# Patient Record
Sex: Female | Born: 1995 | Race: White | Hispanic: No | Marital: Single | Smoking: Never smoker
Health system: Southern US, Community
[De-identification: ages and names within clinical notes are randomized; demographics above are authoritative.]

---

## 2019-02-15 DIAGNOSIS — J3501 Chronic tonsillitis: Secondary | ICD-10-CM | POA: Insufficient documentation

## 2019-12-30 DIAGNOSIS — Z23 Encounter for immunization: Secondary | ICD-10-CM | POA: Diagnosis not present

## 2020-01-27 ENCOUNTER — Ambulatory Visit (INDEPENDENT_AMBULATORY_CARE_PROVIDER_SITE_OTHER): Payer: BC Managed Care – PPO | Admitting: Family Medicine

## 2020-01-27 ENCOUNTER — Other Ambulatory Visit: Payer: Self-pay

## 2020-01-27 ENCOUNTER — Encounter: Payer: Self-pay | Admitting: Family Medicine

## 2020-01-27 DIAGNOSIS — Z0000 Encounter for general adult medical examination without abnormal findings: Secondary | ICD-10-CM | POA: Diagnosis not present

## 2020-01-27 DIAGNOSIS — Z7689 Persons encountering health services in other specified circumstances: Secondary | ICD-10-CM

## 2020-01-27 NOTE — Assessment & Plan Note (Signed)
Patient is here to establish care with a new provider as well as address routine health maintenance concerns. -Covid vaccination status confirmed -Pap is up-to-date at this time with most recent Pap smear being in December 2020, no record of results at this time.  Patient reports that she goes to [facility], will request records at next visit -Most recent STD screening was in December 2020 which she got when she had Pap smear -Discussed Tdap with patient he does not wish to have it at this time but knows that she needs it

## 2020-01-27 NOTE — Patient Instructions (Signed)
It was a pleasure to meet you today!  I have no concerns regarding your current health.  There is no need for any lab work or other tests at this time.  If you have any questions, concerns or healthcare needs please reach out to our clinic and we can schedule an appointment.  I hope you have a wonderful afternoon!

## 2020-01-27 NOTE — Progress Notes (Signed)
    SUBJECTIVE:   CHIEF COMPLAINT / HPI:  Patient presents for new patient  Current concerns Rash wich has been present for one week but is resolving.  Past medical history None   Past surgical history Wisdom teeth- bottom out in 2016   Allergies Season allergies   Family history Maternal IgA deficiency, Celiac? Paternal hyperlipidemia Brother with Crohns  Grandparents (Deseased) with Alzheimer's, type I and type 2 diabetes, and TIA  Aunts/uncles with skin cancer and depression  Social history Lives in [place], getting masters in nutritian. No pets. Social EtOH once a week (3 drinks or less), denies illicit drugs, tobacco, vaping. Sexually active, one partner, STD tested in December with pap.  Patient reports that she got an IUD when she had her Pap smear and STD testing in December.  She is going to follow-up with her obstetrician gynecologist regarding her increase in bleeding after the IUD.   Health maintenance  Most recent Pap smear was 2020 Tetanus shot in 2011, will repeat today  OBJECTIVE:   BP 102/62   Pulse 81   Ht 5\' 7"  (1.702 m)   Wt 144 lb 12.8 oz (65.7 kg)   LMP 01/20/2020 (Approximate)   SpO2 97%   BMI 22.68 kg/m   General: NAD, sitting comfortably, no acute distress HEENT: Atraumatic. Normocephalic. Normal oropharynx without erythema, lesions, exudate.  Neck: No cervical lymphadenopathy.  Cardiac: RRR, no m/r/g Respiratory: CTAB, normal work of breathing Abdomen: soft, nontender, nondistended, bowel sounds normal Skin: warm and dry, patient has healing rash on her elbows bilaterally.  She reports they have progressively gotten better over the last week and that they do not itch Neuro: alert and oriented    ASSESSMENT/PLAN:   Encounter to establish care with new doctor Patient is here to establish care with a new provider as well as address routine health maintenance concerns. -Covid vaccination status confirmed -Pap is up-to-date at this  time with most recent Pap smear being in December 2020, no record of results at this time.  Patient reports that she goes to [facility], will request records at next visit -Most recent STD screening was in December 2020 which she got when she had Pap smear -Discussed Tdap with patient he does not wish to have it at this time but knows that she needs it   January 2021, MD [facility]

## 2020-04-07 ENCOUNTER — Encounter: Payer: Self-pay | Admitting: Family Medicine

## 2020-04-08 DIAGNOSIS — J988 Other specified respiratory disorders: Secondary | ICD-10-CM | POA: Diagnosis not present

## 2020-05-17 ENCOUNTER — Encounter: Payer: Self-pay | Admitting: Family Medicine

## 2020-05-18 NOTE — Telephone Encounter (Signed)
I called patient to discuss this insect bites.  She reports that the symptoms are mildly improving and the swelling is gone down but that it kind of looks like a bruise around where the bug stung her.  Discussed options of being evaluated for possible infection versus keeping an eye on it and calling if symptoms get worse.  She reports that she will continue to watch it but if her symptoms progress she will call to schedule an appointment.  Strict ED precautions given.  No further questions or concerns.

## 2020-05-20 ENCOUNTER — Encounter: Payer: Self-pay | Admitting: Family Medicine

## 2020-05-28 ENCOUNTER — Encounter: Payer: Self-pay | Admitting: Family Medicine

## 2020-05-28 NOTE — Telephone Encounter (Signed)
Called patient regarding OB/GYN questions.  Informed the patient that we provide full-spectrum care and that we can do her Pap smear and that she will need 1 in 3 years assuming her previous Pap was normal.  She is going to get the results from her previous Pap and bring them to the clinic so that we have those results.  I also discussed her heart palpitations.  She reports that she has been having heart palpitations for years but has noticed them more recently.  They come on when she is lying around and she cannot identify any cause of the heart palpitations.  She does report that anxiety may have something to do with it but is unsure because she has never been diagnosed with any anxiety issues.  I discussed keeping track of when she has heart palpitations to search for a possible cause.  Patient is agreeable to this.  Denies any chest pain, shortness of breath associated with the heart palpitations.   Patient also has questions about if she can receive her Tdap.  I instructed her to call the clinic and schedule a nurse visit for Tdap.  Patient is agreeable to this, has no questions or concerns.

## 2020-08-03 ENCOUNTER — Encounter: Payer: Self-pay | Admitting: Family Medicine

## 2020-08-05 ENCOUNTER — Telehealth: Payer: Self-pay

## 2020-08-05 ENCOUNTER — Ambulatory Visit (INDEPENDENT_AMBULATORY_CARE_PROVIDER_SITE_OTHER): Payer: BC Managed Care – PPO | Admitting: Family Medicine

## 2020-08-05 ENCOUNTER — Encounter: Payer: Self-pay | Admitting: Family Medicine

## 2020-08-05 ENCOUNTER — Other Ambulatory Visit: Payer: Self-pay

## 2020-08-05 VITALS — BP 123/80 | HR 88 | Ht 67.0 in | Wt 147.4 lb

## 2020-08-05 DIAGNOSIS — N926 Irregular menstruation, unspecified: Secondary | ICD-10-CM

## 2020-08-05 DIAGNOSIS — T8332XA Displacement of intrauterine contraceptive device, initial encounter: Secondary | ICD-10-CM

## 2020-08-05 LAB — POCT URINE PREGNANCY: Preg Test, Ur: NEGATIVE

## 2020-08-05 NOTE — Patient Instructions (Signed)
It was great seeing you today.  I am sorry you are having issues with your IUD.  We did a pelvic exam and did not see the strings.  I did a bedside ultrasound for educational purposes and could not get a good picture of the IUD.  We have ordered an official abdominal ultrasound and transvaginal ultrasound to locate the IUD.  I am also going to check a pregnancy test just to make sure that you are not pregnant.  You have any questions or concerns please feel free to call the clinic.  I hope you have a wonderful afternoon!

## 2020-08-05 NOTE — Progress Notes (Signed)
    SUBJECTIVE:   CHIEF COMPLAINT / HPI:   Patient is here for an IUD string check Patient reports that she was seen by her OB/GYN [facility] approximately 1 year ago where they checked to make sure IUD was in place but reported they did not see any strings.  She had the IUD placed a few weeks before the string check.  She was not never instructed to follow-up with them and they did not obtain any imaging.  It is concerning her at this time so she would like for [place] to check and make sure that it is in the correct place.  Patient reports she is sexually active but does not use protection because she has an IUD.  OBJECTIVE:   BP 123/80   Pulse 88   Ht 5\' 7"  (1.702 m)   Wt 147 lb 6.4 oz (66.9 kg)   SpO2 95%   BMI 23.09 kg/m   General: Well-appearing 25 year old female, no acute distress Cardiac: Regular rate and rhythm, no murmurs appreciated Respiratory: Normal work of breathing Abdomen: Soft, nontender, positive bowel sounds GU: Chaperone was present for this entire exam, normal external vaginal tissue, normal internal vaginal mucosa with moderate amount of of discharge with small amount of blood from the cervix.  Cervical ectopy present.  IUD strings were not visible on speculum exam, using cervical brush unable to hook strings  ASSESSMENT/PLAN:   IUD threads lost Patient had IUD placed approximately 1 year ago.  She reports that the string check they were unable to identify the strings.  She never was told to follow-up with them regarding if it was correctly in place but she is concerned about this because she is sexually active and is not wishing to become pregnant at this time.  Physical exam today showed no identifiable IUD strings.  Urine pregnancy test was negative.  Pelvic and transvaginal ultrasound ordered and scheduled with the patient.  Patient instructed to use other forms of contraception until we are sure that the IUD is in place.  Follow-up as needed.   30, MD [facility]

## 2020-08-05 NOTE — Assessment & Plan Note (Addendum)
Patient had IUD placed approximately 1 year ago.  She reports that the string check they were unable to identify the strings.  She never was told to follow-up with them regarding if it was correctly in place but she is concerned about this because she is sexually active and is not wishing to become pregnant at this time.  Physical exam today showed no identifiable IUD strings.  Urine pregnancy test was negative.  Pelvic and transvaginal ultrasound ordered and scheduled with the patient.  Patient instructed to use other forms of contraception until we are sure that the IUD is in place.  Follow-up as needed.

## 2020-08-05 NOTE — Telephone Encounter (Signed)
Called patient with information on:  Transvaginal Pelvic Complete Ultrasound 08/17/2020 1545 with arrival [address] [facility] [address], [address] [place], [place] [address] [phone]  Patient is to arrive with a full bladder (drink 32 oz water)  Patient verbalized understanding.  Glennie Hawk, CMA

## 2020-08-17 ENCOUNTER — Ambulatory Visit
Admission: RE | Admit: 2020-08-17 | Discharge: 2020-08-17 | Disposition: A | Discharge status: Home / Self Care | Payer: BC Managed Care – PPO | Source: Ambulatory Visit | Attending: Family Medicine | Admitting: Family Medicine

## 2020-08-17 ENCOUNTER — Other Ambulatory Visit: Payer: Self-pay

## 2020-08-17 DIAGNOSIS — T8332XA Displacement of intrauterine contraceptive device, initial encounter: Secondary | ICD-10-CM | POA: Insufficient documentation

## 2020-08-17 DIAGNOSIS — Z30431 Encounter for routine checking of intrauterine contraceptive device: Secondary | ICD-10-CM | POA: Diagnosis not present

## 2020-08-18 ENCOUNTER — Encounter: Payer: Self-pay | Admitting: Family Medicine

## 2020-09-15 ENCOUNTER — Encounter: Payer: Self-pay | Admitting: Family Medicine

## 2020-09-28 ENCOUNTER — Encounter: Payer: Self-pay | Admitting: Family Medicine

## 2020-09-28 NOTE — Telephone Encounter (Signed)
Called patient and discussed Raynaud as well as her cough. She is going to take a rapid COVID test at home and start nasal spray and OTC antihistamines. Strict follow up given.

## 2020-10-26 ENCOUNTER — Encounter: Payer: Self-pay | Admitting: Family Medicine

## 2020-12-17 ENCOUNTER — Other Ambulatory Visit: Payer: Self-pay

## 2020-12-17 ENCOUNTER — Ambulatory Visit: Payer: BC Managed Care – PPO

## 2021-01-05 ENCOUNTER — Encounter: Payer: Self-pay | Admitting: Family Medicine
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
## 2021-01-08 ENCOUNTER — Ambulatory Visit (INDEPENDENT_AMBULATORY_CARE_PROVIDER_SITE_OTHER): Payer: BC Managed Care – PPO | Admitting: Family Medicine

## 2021-01-08 ENCOUNTER — Ambulatory Visit
Admission: RE | Admit: 2021-01-08 | Discharge: 2021-01-08 | Disposition: A | Discharge status: Home / Self Care | Payer: BC Managed Care – PPO | Source: Ambulatory Visit | Attending: Family Medicine | Admitting: Family Medicine

## 2021-01-08 ENCOUNTER — Other Ambulatory Visit: Payer: Self-pay

## 2021-01-08 ENCOUNTER — Encounter: Payer: Self-pay | Admitting: Family Medicine

## 2021-01-08 VITALS — BP 110/68 | HR 68 | Ht 67.0 in | Wt 141.0 lb

## 2021-01-08 DIAGNOSIS — M533 Sacrococcygeal disorders, not elsewhere classified: Secondary | ICD-10-CM | POA: Diagnosis not present

## 2021-01-08 DIAGNOSIS — S3992XA Unspecified injury of lower back, initial encounter: Secondary | ICD-10-CM | POA: Insufficient documentation

## 2021-01-08 NOTE — Patient Instructions (Signed)
Thank you for coming to see me today. It was a pleasure. Today we talked about:   I have placed an order for an x-ray of your sacrum and coccyx.  Please go to [facility] to have this completed.  You do not need an appointment.  We will contact you with your results afterwards.  Please follow-up with PCP in 4 weeks if not improved.  If you have any questions or concerns, please do not hesitate to call the office at [phone].  Best,   Luis Abed, DO
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
## 2021-01-11 ENCOUNTER — Ambulatory Visit (INDEPENDENT_AMBULATORY_CARE_PROVIDER_SITE_OTHER): Payer: BC Managed Care – PPO | Admitting: Family Medicine

## 2021-01-11 ENCOUNTER — Other Ambulatory Visit: Payer: Self-pay

## 2021-01-11 VITALS — Ht 67.0 in | Wt 140.6 lb

## 2021-01-11 DIAGNOSIS — Z1159 Encounter for screening for other viral diseases: Secondary | ICD-10-CM | POA: Diagnosis not present

## 2021-01-11 DIAGNOSIS — Z0000 Encounter for general adult medical examination without abnormal findings: Secondary | ICD-10-CM

## 2021-01-11 DIAGNOSIS — Z114 Encounter for screening for human immunodeficiency virus [HIV]: Secondary | ICD-10-CM

## 2021-01-11 NOTE — Patient Instructions (Signed)
It was great seeing you today.  We are going to collect some routine screening labs on you.  You do not need your Tdap because you are up-to-date on your tetanus.  If it turns out they do require an update on your Tdap please let us know and we can do a nurse visit where you come by and just get the shot.  If you have any other issues or concerns please feel free to call the clinic.  I hope you have a wonderful day!

## 2021-01-11 NOTE — Assessment & Plan Note (Signed)
Currently doing well and has no concerns at this time. - Pap is up-to-date per gynecology office.  We have these results but they have not yet been uploaded - Collected hepatitis C and HIV screening - Discussed Tdap, it appears patient is up-to-date on her tetanus vaccine so no need for Tdap.  If she needs that she will call and schedule nurses visit.

## 2021-01-11 NOTE — Progress Notes (Signed)
    SUBJECTIVE:   Chief compliant/HPI: annual examination  Tonya Garza is a 25 y.o. who presents today for an annual exam.   Review of systems form notable for tailbone pain.  Updated history tabs and problem list.   OBJECTIVE:   Ht 5\' 7"  (1.702 m)   Wt 140 lb 9.6 oz (63.8 kg)   BMI 22.02 kg/m   Physical Exam Vitals reviewed.  Constitutional:      General: She is not in acute distress.    Appearance: Normal appearance.  HENT:     Head: Normocephalic and atraumatic.     Nose: Nose normal. No congestion or rhinorrhea.     Mouth/Throat:     Mouth: Mucous membranes are moist.     Pharynx: No oropharyngeal exudate or posterior oropharyngeal erythema.  Eyes:     Extraocular Movements: Extraocular movements intact.     Conjunctiva/sclera: Conjunctivae normal.     Pupils: Pupils are equal, round, and reactive to light.  Cardiovascular:     Rate and Rhythm: Normal rate and regular rhythm.     Pulses: Normal pulses.     Heart sounds: Normal heart sounds.  Pulmonary:     Effort: Pulmonary effort is normal.     Breath sounds: Normal breath sounds.  Abdominal:     General: Abdomen is flat. Bowel sounds are normal.     Palpations: Abdomen is soft.  Musculoskeletal:        General: Normal range of motion.     Cervical back: Normal range of motion and neck supple.  Skin:    General: Skin is warm.     Capillary Refill: Capillary refill takes less than 2 seconds.  Neurological:     General: No focal deficit present.     Mental Status: She is alert.  Psychiatric:        Mood and Affect: Mood normal.      PHQ9 SCORE ONLY 01/11/2021 01/08/2021 08/05/2020  PHQ-9 Total Score 3 2 1      ASSESSMENT/PLAN:   No problem-specific Assessment & Plan notes found for this encounter.    Annual Examination  See AVS for age appropriate recommendations.   PHQ score 3, reviewed and discussed. Blood pressure reviewed and at goal.  The patient currently uses IUD for contraception.    Considered the following items based upon USPSTF recommendations: HIV testing: ordered Hepatitis C: ordered Reviewed risk factors for latent tuberculosis and not indicated  Cervical cancer screening:  Patient recently had Pap smear completed at gynecology office.  We have the results but they have not yet been uploaded to her chart. Immunizations up-to-date  Follow up in 1 year or sooner if indicated.    08/07/2020, MD [facility]

## 2021-01-12 LAB — HIV ANTIBODY (ROUTINE TESTING W REFLEX): HIV Screen 4th Generation wRfx: NONREACTIVE

## 2021-01-12 LAB — HEPATITIS C ANTIBODY: Hep C Virus Ab: <0.1 s/co ratio (ref 0.0–0.9)

## 2021-02-22 ENCOUNTER — Encounter: Payer: Self-pay | Admitting: Family Medicine

## 2021-02-23 ENCOUNTER — Other Ambulatory Visit: Payer: Self-pay | Admitting: Family Medicine

## 2021-02-23 ENCOUNTER — Other Ambulatory Visit: Payer: Self-pay | Admitting: *Deleted

## 2021-02-23 DIAGNOSIS — Z111 Encounter for screening for respiratory tuberculosis: Secondary | ICD-10-CM

## 2021-02-26 ENCOUNTER — Other Ambulatory Visit: Payer: BC Managed Care – PPO

## 2021-02-26 ENCOUNTER — Other Ambulatory Visit: Payer: Self-pay

## 2021-02-26 DIAGNOSIS — Z111 Encounter for screening for respiratory tuberculosis: Secondary | ICD-10-CM

## 2021-03-06 LAB — QUANTIFERON-TB GOLD PLUS
QuantiFERON Mitogen Value: >10 IU/mL
QuantiFERON Nil Value: 0 IU/mL
QuantiFERON TB1 Ag Value: 0 IU/mL
QuantiFERON TB2 Ag Value: 0 IU/mL
QuantiFERON-TB Gold Plus: NEGATIVE

## 2021-05-12 DIAGNOSIS — F4323 Adjustment disorder with mixed anxiety and depressed mood: Secondary | ICD-10-CM | POA: Diagnosis not present

## 2021-05-19 DIAGNOSIS — F4323 Adjustment disorder with mixed anxiety and depressed mood: Secondary | ICD-10-CM | POA: Diagnosis not present

## 2021-05-26 DIAGNOSIS — F4323 Adjustment disorder with mixed anxiety and depressed mood: Secondary | ICD-10-CM | POA: Diagnosis not present

## 2021-06-01 DIAGNOSIS — F4323 Adjustment disorder with mixed anxiety and depressed mood: Secondary | ICD-10-CM | POA: Diagnosis not present

## 2021-06-07 DIAGNOSIS — F4323 Adjustment disorder with mixed anxiety and depressed mood: Secondary | ICD-10-CM | POA: Diagnosis not present

## 2021-06-22 DIAGNOSIS — F4323 Adjustment disorder with mixed anxiety and depressed mood: Secondary | ICD-10-CM | POA: Diagnosis not present

## 2021-06-29 DIAGNOSIS — F4323 Adjustment disorder with mixed anxiety and depressed mood: Secondary | ICD-10-CM | POA: Diagnosis not present

## 2021-07-06 ENCOUNTER — Encounter: Payer: Self-pay | Admitting: Family Medicine

## 2021-07-06 ENCOUNTER — Emergency Department (HOSPITAL_COMMUNITY)
Admission: EM | Admit: 2021-07-06 | Discharge: 2021-07-06 | Disposition: A | Discharge status: Home / Self Care | Payer: BC Managed Care – PPO | Attending: Student | Admitting: Student

## 2021-07-06 DIAGNOSIS — S70312A Abrasion, left thigh, initial encounter: Secondary | ICD-10-CM | POA: Insufficient documentation

## 2021-07-06 DIAGNOSIS — Y92410 Unspecified street and highway as the place of occurrence of the external cause: Secondary | ICD-10-CM | POA: Insufficient documentation

## 2021-07-06 DIAGNOSIS — S79922A Unspecified injury of left thigh, initial encounter: Secondary | ICD-10-CM | POA: Diagnosis not present

## 2021-07-06 DIAGNOSIS — M546 Pain in thoracic spine: Secondary | ICD-10-CM | POA: Insufficient documentation

## 2021-07-06 DIAGNOSIS — M542 Cervicalgia: Secondary | ICD-10-CM | POA: Diagnosis not present

## 2021-07-06 DIAGNOSIS — M549 Dorsalgia, unspecified: Secondary | ICD-10-CM | POA: Diagnosis not present
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
## 2021-07-09 ENCOUNTER — Other Ambulatory Visit: Payer: Self-pay

## 2021-07-09 ENCOUNTER — Ambulatory Visit (INDEPENDENT_AMBULATORY_CARE_PROVIDER_SITE_OTHER): Payer: BC Managed Care – PPO | Admitting: Family Medicine

## 2021-07-09 VITALS — BP 110/76 | HR 60 | Ht 67.0 in | Wt 142.1 lb

## 2021-07-09 DIAGNOSIS — S134XXA Sprain of ligaments of cervical spine, initial encounter: Secondary | ICD-10-CM | POA: Diagnosis not present

## 2021-07-09 MED ORDER — CYCLOBENZAPRINE HCL 5 MG PO TABS: 5.0000 mg | ORAL_TABLET | Freq: Every day | ORAL | 0 refills | Status: AC
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
## 2021-07-09 NOTE — Patient Instructions (Addendum)
Thank you for coming to see me today. It was a pleasure.   This will take time to heal.  Likely weeks to months  Continue Tylenol 500 mg every 4 hours for 14 days Ibuprofen 600 mg every 8 hours for 14 days Flexeril 5 mg at night for 7 days Heat/Ice as needed  Gentle exercises to keep muscles from stiffening   Please follow-up with PCP in 2 weeks Jan 9 at 330 pm  If you have any questions or concerns, please do not hesitate to call the office at [phone].  Best,   Dana Allan, MD    Neck Exercises Ask your health care provider which exercises are safe for you. Do exercises exactly as told by your health care provider and adjust them as directed. It is normal to feel mild stretching, pulling, tightness, or discomfort as you do these exercises. Stop right away if you feel sudden pain or your pain gets worse. Do not begin these exercises until told by your health care provider. Neck exercises can be important for many reasons. They can improve strength and maintain flexibility in your neck, which will help your upper back and prevent neck pain. Stretching exercises Rotation neck stretching  Sit in a chair or stand up. Place your feet flat on the floor, shoulder-width apart. Slowly turn your head (rotate) to the right until a slight stretch is felt. Turn it all the way to the right so you can look over your right shoulder. Do not tilt or tip your head. Hold this position for 10-30 seconds. Slowly turn your head (rotate) to the left until a slight stretch is felt. Turn it all the way to the left so you can look over your left shoulder. Do not tilt or tip your head. Hold this position for 10-30 seconds. Repeat __________ times. Complete this exercise __________ times a day. Neck retraction  Sit in a sturdy chair or stand up. Look straight ahead. Do not bend your neck. Use your fingers to push your chin backward (retraction). Do not bend your neck for this movement. Continue to face  straight ahead. If you are doing the exercise properly, you will feel a slight sensation in your throat and a stretch at the back of your neck. Hold the stretch for 1-2 seconds. Repeat __________ times. Complete this exercise __________ times a day. Strengthening exercises Neck press  Lie on your back on a firm bed or on the floor with a pillow under your head. Use your neck muscles to push your head down on the pillow and straighten your spine. Hold the position as well as you can. Keep your head facing up (in a neutral position) and your chin tucked. Slowly count to 5 while holding this position. Repeat __________ times. Complete this exercise __________ times a day. Isometrics These are exercises in which you strengthen the muscles in your neck while keeping your neck still (isometrics). Sit in a supportive chair and place your hand on your forehead. Keep your head and face facing straight ahead. Do not flex or extend your neck while doing isometrics. Push forward with your head and neck while pushing back with your hand. Hold for 10 seconds. Do the sequence again, this time putting your hand against the back of your head. Use your head and neck to push backward against the hand pressure. Finally, do the same exercise on either side of your head, pushing sideways against the pressure of your hand. Repeat __________ times. Complete this  exercise __________ times a day. Prone head lifts  Lie face-down (prone position), resting on your elbows so that your chest and upper back are raised. Start with your head facing downward, near your chest. Position your chin either on or near your chest. Slowly lift your head upward. Lift until you are looking straight ahead. Then continue lifting your head as far back as you can comfortably stretch. Hold your head up for 5 seconds. Then slowly lower it to your starting position. Repeat __________ times. Complete this exercise __________ times a  day. Supine head lifts  Lie on your back (supine position), bending your knees to point to the ceiling and keeping your feet flat on the floor. Lift your head slowly off the floor, raising your chin toward your chest. Hold for 5 seconds. Repeat __________ times. Complete this exercise __________ times a day. Scapular retraction  Stand with your arms at your sides. Look straight ahead. Slowly pull both shoulders (scapulae) backward and downward (retraction) until you feel a stretch between your shoulder blades in your upper back. Hold for 10-30 seconds. Relax and repeat. Repeat __________ times. Complete this exercise __________ times a day. Contact a health care provider if: Your neck pain or discomfort gets worse when you do an exercise. Your neck pain or discomfort does not improve within 2 hours after you exercise. If you have any of these problems, stop exercising right away. Do not do the exercises again unless your health care provider says that you can. Get help right away if: You develop sudden, severe neck pain. If this happens, stop exercising right away. Do not do the exercises again unless your health care provider says that you can. This information is not intended to replace advice given to you by your health care provider. Make sure you discuss any questions you have with your health care provider. Document Revised: 12/29/2020 Document Reviewed: 12/29/2020 Elsevier Patient Education  2022 [employer].

## 2021-07-11 ENCOUNTER — Encounter: Payer: Self-pay | Admitting: Family Medicine

## 2021-07-11 NOTE — Assessment & Plan Note (Signed)
No red flags on exam. Tylenol 500 mg q4h x 14 days Ibuprofen 600 mg q8h x 14 days Flexeril 5 mg qhs x 7 days Gentle neck exercises  Heat/Ice as needed Advised that pain may take weeks to resolve Follow up with PCP in 2 weeks or sooner if worsens Strict return precautions provided Can consider imaging if no improvement at next visit

## 2021-07-15 ENCOUNTER — Encounter: Payer: Self-pay | Admitting: Family Medicine

## 2021-07-16 ENCOUNTER — Other Ambulatory Visit: Payer: Self-pay | Admitting: Family Medicine

## 2021-07-16 ENCOUNTER — Ambulatory Visit
Admission: RE | Admit: 2021-07-16 | Discharge: 2021-07-16 | Disposition: A | Discharge status: Home / Self Care | Payer: BC Managed Care – PPO | Source: Ambulatory Visit | Attending: Family Medicine | Admitting: Family Medicine

## 2021-07-16 DIAGNOSIS — S134XXA Sprain of ligaments of cervical spine, initial encounter: Secondary | ICD-10-CM

## 2021-07-16 NOTE — Progress Notes (Signed)
Please schedule CT neck for patient.  Dana Allan, MD [facility]

## 2021-07-16 NOTE — Progress Notes (Signed)
Confirmed with Dr. Clent Ridges that this was not a CT but an xray, then contacted pt and let her know that she could just walk in to either GI and have this done.Tonya Garza, CMA

## 2021-07-19 ENCOUNTER — Encounter: Payer: Self-pay | Admitting: Family Medicine

## 2021-07-20 DIAGNOSIS — F4323 Adjustment disorder with mixed anxiety and depressed mood: Secondary | ICD-10-CM | POA: Diagnosis not present

## 2021-07-21 ENCOUNTER — Encounter: Payer: Self-pay | Admitting: Family Medicine

## 2021-07-26 ENCOUNTER — Other Ambulatory Visit: Payer: Self-pay

## 2021-07-26 ENCOUNTER — Encounter: Payer: Self-pay | Admitting: Family Medicine

## 2021-07-26 ENCOUNTER — Ambulatory Visit (INDEPENDENT_AMBULATORY_CARE_PROVIDER_SITE_OTHER): Payer: BC Managed Care – PPO | Admitting: Family Medicine

## 2021-07-26 VITALS — BP 114/70 | HR 72 | Ht 67.0 in | Wt 139.8 lb

## 2021-07-26 DIAGNOSIS — R070 Pain in throat: Secondary | ICD-10-CM
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
## 2021-07-27 DIAGNOSIS — R070 Pain in throat: Secondary | ICD-10-CM | POA: Insufficient documentation
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
## 2021-07-30 DIAGNOSIS — F4323 Adjustment disorder with mixed anxiety and depressed mood: Secondary | ICD-10-CM | POA: Diagnosis not present

## 2021-08-03 DIAGNOSIS — F4323 Adjustment disorder with mixed anxiety and depressed mood: Secondary | ICD-10-CM | POA: Diagnosis not present

## 2021-08-06 ENCOUNTER — Encounter: Payer: Self-pay | Admitting: Family Medicine

## 2021-08-08 ENCOUNTER — Encounter: Payer: Self-pay | Admitting: Family Medicine

## 2021-08-09 ENCOUNTER — Encounter: Payer: Self-pay | Admitting: Family Medicine

## 2021-08-10 DIAGNOSIS — F4323 Adjustment disorder with mixed anxiety and depressed mood: Secondary | ICD-10-CM | POA: Diagnosis not present

## 2021-08-17 DIAGNOSIS — F4323 Adjustment disorder with mixed anxiety and depressed mood: Secondary | ICD-10-CM | POA: Diagnosis not present

## 2021-08-20 ENCOUNTER — Encounter: Payer: Self-pay | Admitting: Family Medicine

## 2021-08-20 ENCOUNTER — Ambulatory Visit (INDEPENDENT_AMBULATORY_CARE_PROVIDER_SITE_OTHER): Payer: BC Managed Care – PPO

## 2021-08-20 ENCOUNTER — Other Ambulatory Visit (HOSPITAL_COMMUNITY)
Admission: RE | Admit: 2021-08-20 | Discharge: 2021-08-20 | Disposition: A | Discharge status: Home / Self Care | Payer: BC Managed Care – PPO | Source: Ambulatory Visit | Attending: Family Medicine | Admitting: Family Medicine

## 2021-08-20 ENCOUNTER — Ambulatory Visit (INDEPENDENT_AMBULATORY_CARE_PROVIDER_SITE_OTHER): Payer: BC Managed Care – PPO | Admitting: Family Medicine

## 2021-08-20 ENCOUNTER — Other Ambulatory Visit: Payer: Self-pay

## 2021-08-20 VITALS — BP 98/62 | HR 100 | Ht 67.0 in | Wt 140.0 lb

## 2021-08-20 DIAGNOSIS — S134XXA Sprain of ligaments of cervical spine, initial encounter: Secondary | ICD-10-CM | POA: Diagnosis not present

## 2021-08-20 DIAGNOSIS — Z041 Encounter for examination and observation following transport accident: Secondary | ICD-10-CM | POA: Diagnosis not present

## 2021-08-20 DIAGNOSIS — Z124 Encounter for screening for malignant neoplasm of cervix: Secondary | ICD-10-CM | POA: Insufficient documentation

## 2021-08-20 DIAGNOSIS — Z975 Presence of (intrauterine) contraceptive device: Secondary | ICD-10-CM | POA: Diagnosis not present

## 2021-08-20 DIAGNOSIS — T8332XA Displacement of intrauterine contraceptive device, initial encounter: Secondary | ICD-10-CM | POA: Diagnosis not present

## 2021-08-20 NOTE — Assessment & Plan Note (Signed)
Patient has an IUD which was placed 2 years ago.  Recently had an abdominal x-ray which showed abnormal placement of the IUD.  She would like to make sure that it is still working because she is sexually active with a partner and does not use protection.  Physical exam today showed no visible IUD strings and were not able to be teased out using brush.  Ultrasound ordered to be done today at 11 AM.  Patient may need referral for gynecology for removal and replacement of the IUD.
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
## 2021-08-20 NOTE — Assessment & Plan Note (Signed)
Patient continues to have intermittent neck pain.  Physical exam was reassuring although this has been going on for over a month now.  Patient is going to see OMM doctor in 4 days.  We discussed physical therapy referral which patient is interested in but would like to see the OMM doctor first.  She will be in [place] for the rest of the semester so may need a physical therapist in the [place] area.  She will message me after the appointment if she needs a referral.

## 2021-08-20 NOTE — Patient Instructions (Addendum)
It was great seeing you today!  Regarding your IUD I could not see the strings and I have ordered an ultrasound to check and make sure it is in the correct place.  That appointment has been scheduled for 11:00 this morning at [facility].  We also collected your Pap today.  I will send you a MyChart message with the results from the Pap smear.  Regarding your neck pain please let me know if you need a referral for physical therapy.  I hope you have a great afternoon and if you have any questions just shoot me a MyChart message.

## 2021-08-23 DIAGNOSIS — S46811A Strain of other muscles, fascia and tendons at shoulder and upper arm level, right arm, initial encounter: Secondary | ICD-10-CM | POA: Diagnosis not present

## 2021-08-23 DIAGNOSIS — S134XXS Sprain of ligaments of cervical spine, sequela: Secondary | ICD-10-CM | POA: Diagnosis not present

## 2021-08-23 DIAGNOSIS — M9900 Segmental and somatic dysfunction of head region: Secondary | ICD-10-CM | POA: Diagnosis not present

## 2021-08-23 DIAGNOSIS — M9901 Segmental and somatic dysfunction of cervical region: Secondary | ICD-10-CM | POA: Diagnosis not present

## 2021-08-23 LAB — CYTOLOGY - PAP
Chlamydia: NEGATIVE
Comment: NEGATIVE
Comment: NORMAL
Diagnosis: NEGATIVE
Neisseria Gonorrhea: NEGATIVE

## 2021-08-26 DIAGNOSIS — F4323 Adjustment disorder with mixed anxiety and depressed mood: Secondary | ICD-10-CM | POA: Diagnosis not present

## 2021-09-01 DIAGNOSIS — M542 Cervicalgia: Secondary | ICD-10-CM | POA: Diagnosis not present

## 2021-09-01 DIAGNOSIS — S46811A Strain of other muscles, fascia and tendons at shoulder and upper arm level, right arm, initial encounter: Secondary | ICD-10-CM | POA: Diagnosis not present

## 2021-09-01 DIAGNOSIS — S134XXS Sprain of ligaments of cervical spine, sequela: Secondary | ICD-10-CM | POA: Diagnosis not present

## 2021-09-02 DIAGNOSIS — F4323 Adjustment disorder with mixed anxiety and depressed mood: Secondary | ICD-10-CM | POA: Diagnosis not present

## 2021-09-03 DIAGNOSIS — M542 Cervicalgia: Secondary | ICD-10-CM | POA: Diagnosis not present

## 2021-09-03 DIAGNOSIS — S46811A Strain of other muscles, fascia and tendons at shoulder and upper arm level, right arm, initial encounter: Secondary | ICD-10-CM | POA: Diagnosis not present

## 2021-09-03 DIAGNOSIS — S134XXS Sprain of ligaments of cervical spine, sequela: Secondary | ICD-10-CM | POA: Diagnosis not present

## 2021-09-07 DIAGNOSIS — S46811A Strain of other muscles, fascia and tendons at shoulder and upper arm level, right arm, initial encounter: Secondary | ICD-10-CM | POA: Diagnosis not present

## 2021-09-07 DIAGNOSIS — M542 Cervicalgia: Secondary | ICD-10-CM | POA: Diagnosis not present

## 2021-09-07 DIAGNOSIS — S134XXS Sprain of ligaments of cervical spine, sequela: Secondary | ICD-10-CM | POA: Diagnosis not present

## 2021-09-08 DIAGNOSIS — S46811A Strain of other muscles, fascia and tendons at shoulder and upper arm level, right arm, initial encounter: Secondary | ICD-10-CM | POA: Diagnosis not present

## 2021-09-08 DIAGNOSIS — M9900 Segmental and somatic dysfunction of head region: Secondary | ICD-10-CM | POA: Diagnosis not present

## 2021-09-08 DIAGNOSIS — M9901 Segmental and somatic dysfunction of cervical region: Secondary | ICD-10-CM | POA: Diagnosis not present

## 2021-09-08 DIAGNOSIS — M9902 Segmental and somatic dysfunction of thoracic region: Secondary | ICD-10-CM | POA: Diagnosis not present

## 2021-09-09 DIAGNOSIS — F4323 Adjustment disorder with mixed anxiety and depressed mood: Secondary | ICD-10-CM | POA: Diagnosis not present

## 2021-09-10 DIAGNOSIS — S46811A Strain of other muscles, fascia and tendons at shoulder and upper arm level, right arm, initial encounter: Secondary | ICD-10-CM | POA: Diagnosis not present

## 2021-09-10 DIAGNOSIS — M542 Cervicalgia: Secondary | ICD-10-CM | POA: Diagnosis not present

## 2021-09-10 DIAGNOSIS — S134XXS Sprain of ligaments of cervical spine, sequela: Secondary | ICD-10-CM | POA: Diagnosis not present

## 2021-09-14 DIAGNOSIS — M542 Cervicalgia: Secondary | ICD-10-CM | POA: Diagnosis not present

## 2021-09-14 DIAGNOSIS — F4323 Adjustment disorder with mixed anxiety and depressed mood: Secondary | ICD-10-CM | POA: Diagnosis not present

## 2021-09-14 DIAGNOSIS — S46811A Strain of other muscles, fascia and tendons at shoulder and upper arm level, right arm, initial encounter: Secondary | ICD-10-CM | POA: Diagnosis not present

## 2021-09-14 DIAGNOSIS — S134XXS Sprain of ligaments of cervical spine, sequela: Secondary | ICD-10-CM | POA: Diagnosis not present

## 2021-09-16 ENCOUNTER — Encounter: Payer: Self-pay | Admitting: Family Medicine

## 2021-09-16 DIAGNOSIS — S134XXA Sprain of ligaments of cervical spine, initial encounter: Secondary | ICD-10-CM

## 2021-09-17 DIAGNOSIS — M542 Cervicalgia: Secondary | ICD-10-CM | POA: Diagnosis not present

## 2021-09-17 DIAGNOSIS — S46811A Strain of other muscles, fascia and tendons at shoulder and upper arm level, right arm, initial encounter: Secondary | ICD-10-CM | POA: Diagnosis not present

## 2021-09-17 DIAGNOSIS — S134XXS Sprain of ligaments of cervical spine, sequela: Secondary | ICD-10-CM | POA: Diagnosis not present

## 2021-09-22 DIAGNOSIS — F4323 Adjustment disorder with mixed anxiety and depressed mood: Secondary | ICD-10-CM | POA: Diagnosis not present

## 2021-09-27 DIAGNOSIS — F4323 Adjustment disorder with mixed anxiety and depressed mood: Secondary | ICD-10-CM | POA: Diagnosis not present

## 2021-10-06 DIAGNOSIS — F4323 Adjustment disorder with mixed anxiety and depressed mood: Secondary | ICD-10-CM | POA: Diagnosis not present

## 2021-10-07 ENCOUNTER — Other Ambulatory Visit: Payer: Self-pay

## 2021-10-07 ENCOUNTER — Ambulatory Visit: Payer: BC Managed Care – PPO | Attending: Family Medicine

## 2021-10-07 DIAGNOSIS — M542 Cervicalgia: Secondary | ICD-10-CM | POA: Diagnosis not present

## 2021-10-07 DIAGNOSIS — R252 Cramp and spasm: Secondary | ICD-10-CM | POA: Insufficient documentation

## 2021-10-07 DIAGNOSIS — S134XXA Sprain of ligaments of cervical spine, initial encounter: Secondary | ICD-10-CM | POA: Insufficient documentation
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
## 2021-10-13 DIAGNOSIS — F4323 Adjustment disorder with mixed anxiety and depressed mood: Secondary | ICD-10-CM | POA: Diagnosis not present
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
## 2021-10-14 ENCOUNTER — Ambulatory Visit: Payer: BC Managed Care – PPO

## 2021-10-14 ENCOUNTER — Ambulatory Visit: Payer: BC Managed Care – PPO | Admitting: Surgery

## 2021-10-14 DIAGNOSIS — M542 Cervicalgia: Secondary | ICD-10-CM | POA: Diagnosis not present

## 2021-10-14 DIAGNOSIS — R252 Cramp and spasm: Secondary | ICD-10-CM

## 2021-10-14 DIAGNOSIS — S134XXA Sprain of ligaments of cervical spine, initial encounter: Secondary | ICD-10-CM | POA: Diagnosis not present

## 2021-10-14 NOTE — Patient Instructions (Signed)

## 2021-10-15 ENCOUNTER — Ambulatory Visit: Payer: BC Managed Care – PPO | Admitting: Physician Assistant

## 2021-10-15 ENCOUNTER — Encounter: Payer: Self-pay | Admitting: Physician Assistant

## 2021-10-15 DIAGNOSIS — M542 Cervicalgia: Secondary | ICD-10-CM | POA: Diagnosis not present
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
## 2021-10-16 ENCOUNTER — Ambulatory Visit: Payer: BC Managed Care – PPO | Attending: Family Medicine

## 2021-10-16 ENCOUNTER — Encounter: Payer: Self-pay | Admitting: Family Medicine

## 2021-10-16 DIAGNOSIS — R252 Cramp and spasm: Secondary | ICD-10-CM

## 2021-10-16 DIAGNOSIS — M542 Cervicalgia: Secondary | ICD-10-CM | POA: Diagnosis not present

## 2021-10-18 DIAGNOSIS — F4323 Adjustment disorder with mixed anxiety and depressed mood: Secondary | ICD-10-CM | POA: Diagnosis not present
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
## 2021-10-19 ENCOUNTER — Ambulatory Visit: Payer: BC Managed Care – PPO

## 2021-10-19 DIAGNOSIS — R252 Cramp and spasm: Secondary | ICD-10-CM | POA: Diagnosis not present

## 2021-10-19 DIAGNOSIS — M542 Cervicalgia: Secondary | ICD-10-CM | POA: Diagnosis not present
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
## 2021-10-21 ENCOUNTER — Ambulatory Visit: Payer: BC Managed Care – PPO

## 2021-10-21 DIAGNOSIS — R252 Cramp and spasm: Secondary | ICD-10-CM

## 2021-10-21 DIAGNOSIS — M542 Cervicalgia: Secondary | ICD-10-CM

## 2021-10-26 ENCOUNTER — Ambulatory Visit: Payer: BC Managed Care – PPO

## 2021-10-26 DIAGNOSIS — R252 Cramp and spasm: Secondary | ICD-10-CM | POA: Diagnosis not present

## 2021-10-26 DIAGNOSIS — M542 Cervicalgia: Secondary | ICD-10-CM | POA: Diagnosis not present
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
## 2021-10-27 ENCOUNTER — Ambulatory Visit (INDEPENDENT_AMBULATORY_CARE_PROVIDER_SITE_OTHER): Payer: BC Managed Care – PPO | Admitting: Family Medicine

## 2021-10-27 ENCOUNTER — Ambulatory Visit (INDEPENDENT_AMBULATORY_CARE_PROVIDER_SITE_OTHER): Payer: BC Managed Care – PPO

## 2021-10-27 ENCOUNTER — Encounter: Payer: Self-pay | Admitting: Family Medicine

## 2021-10-27 VITALS — BP 110/64 | HR 79 | Ht 67.0 in | Wt 145.6 lb

## 2021-10-27 DIAGNOSIS — R002 Palpitations: Secondary | ICD-10-CM

## 2021-10-27 NOTE — Progress Notes (Unsigned)
Enrolled for [employer] to mail a ZIO XT long term holter monitor to the patients address on file.  

## 2021-10-27 NOTE — Patient Instructions (Signed)
It was wonderful seeing you today!  Your EKG looks completely normal and I have no concerns from it.  I do want to collect some lab work and I have ordered a monitor that you will placed for 14 days.  This monitor will be shipped to your house and there will be instructions on how to apply it.  If you do not receive anything about it by Monday of next week below is the number to call to make sure it is being processed.  I will call you with the results of the lab work or send you a MyChart message if everything is normal.  If you have any questions or concerns call the clinic.  If the symptoms return and do not resolve please be evaluated immediately.  I hope you have a wonderful day! ? ?[facility] ?[address], [place] ?([place] ?
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
## 2021-10-27 NOTE — Progress Notes (Signed)
. ? ? ?  SUBJECTIVE:  ? ?CHIEF COMPLAINT / HPI:  ? ?Heart palpitations ?Patient reports today for evaluation for heart palpitations.  She has a long history of intermittent heart palpitations which she states occurs more when she is stressed and may have been weekly.  The main reason she is here is because She was lying in bed during the middle of the day on 4/1 when she started noticing heart palpitations with chest pressure that radiated up to her jaw.  These symptoms were brief and resolved spontaneously but concerned her greatly.  Reports that she has had occasional heart palpitations where she feels like she "skipped a beat" since then but has not had any more of those episodes. ? ?OBJECTIVE:  ? ?BP 110/64   Pulse 79   Ht 5\' 7"  (1.702 m)   Wt 145 lb 9.6 oz (66 kg)   SpO2 99%   BMI 22.80 kg/m?   ?General: Pleasant, well-appearing 26 year old female in no acute distress ?Cardiac: Regular rate and rhythm, no murmurs or rubs appreciated ?Respiratory: Normal work of breathing, lungs are clear to auscultation bilaterally ?Abdomen: Soft, nontender ?MSK: No gross abnormalities ? ? ?ASSESSMENT/PLAN:  ? ?Heart palpitations ?Unclear etiology for patient's heart palpitations.  May be related to stress but will search for other causes at this time.  Collecting CBC, BMP, TSH.  EKG today showing normal sinus rhythm.  I have placed an order for Zio patch 14-day heart monitor.  Provided strict ED precautions and follow-up precautions.  If symptoms continue cardiology referral may be warranted. ?  ? ? ?25, MD ?[facility]  ? ?

## 2021-10-28 ENCOUNTER — Ambulatory Visit: Payer: BC Managed Care – PPO

## 2021-10-28 DIAGNOSIS — M542 Cervicalgia: Secondary | ICD-10-CM | POA: Diagnosis not present

## 2021-10-28 DIAGNOSIS — R252 Cramp and spasm: Secondary | ICD-10-CM | POA: Diagnosis not present

## 2021-10-28 DIAGNOSIS — F4323 Adjustment disorder with mixed anxiety and depressed mood: Secondary | ICD-10-CM | POA: Diagnosis not present

## 2021-10-28 DIAGNOSIS — R070 Pain in throat: Secondary | ICD-10-CM | POA: Diagnosis not present

## 2021-10-28 LAB — CBC
Hematocrit: 38.9 % (ref 34.0–46.6)
Hemoglobin: 12.9 g/dL (ref 11.1–15.9)
MCH: 30.9 pg (ref 26.6–33.0)
MCHC: 33.2 g/dL (ref 31.5–35.7)
MCV: 93 fL (ref 79–97)
Platelets: 190 10*3/uL (ref 150–450)
RBC: 4.17 x10E6/uL (ref 3.77–5.28)
RDW: 12.5 % (ref 11.7–15.4)
WBC: 5.8 10*3/uL (ref 3.4–10.8)

## 2021-10-28 LAB — BASIC METABOLIC PANEL
BUN/Creatinine Ratio: 14 (ref 9–23)
BUN: 10 mg/dL (ref 6–20)
CO2: 26 mmol/L (ref 20–29)
Calcium: 9.8 mg/dL (ref 8.7–10.2)
Chloride: 103 mmol/L (ref 96–106)
Creatinine, Ser: 0.72 mg/dL (ref 0.57–1.00)
Glucose: 81 mg/dL (ref 70–99)
Potassium: 3.7 mmol/L (ref 3.5–5.2)
Sodium: 142 mmol/L (ref 134–144)
eGFR: 120 mL/min/{1.73_m2} (ref >59)

## 2021-10-28 LAB — TSH RFX ON ABNORMAL TO FREE T4: TSH: 1.42 u[IU]/mL (ref 0.450–4.500)

## 2021-10-28 NOTE — Assessment & Plan Note (Addendum)
Unclear etiology for patient's heart palpitations.  May be related to stress but will search for other causes at this time.  Collecting CBC, BMP, TSH.  EKG today showing normal sinus rhythm.  I have placed an order for Zio patch 14-day heart monitor.  Provided strict ED precautions and follow-up precautions.  If symptoms continue cardiology referral may be warranted. ?

## 2021-11-01 DIAGNOSIS — F4323 Adjustment disorder with mixed anxiety and depressed mood: Secondary | ICD-10-CM | POA: Diagnosis not present

## 2021-11-01 DIAGNOSIS — R002 Palpitations: Secondary | ICD-10-CM

## 2021-11-02 ENCOUNTER — Ambulatory Visit: Payer: BC Managed Care – PPO

## 2021-11-02 DIAGNOSIS — R252 Cramp and spasm: Secondary | ICD-10-CM | POA: Diagnosis not present

## 2021-11-02 DIAGNOSIS — M542 Cervicalgia: Secondary | ICD-10-CM | POA: Diagnosis not present
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
## 2021-11-04 ENCOUNTER — Ambulatory Visit: Payer: BC Managed Care – PPO

## 2021-11-04 DIAGNOSIS — M542 Cervicalgia: Secondary | ICD-10-CM

## 2021-11-04 DIAGNOSIS — R252 Cramp and spasm: Secondary | ICD-10-CM | POA: Diagnosis not present
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
## 2021-11-09 ENCOUNTER — Ambulatory Visit: Payer: BC Managed Care – PPO

## 2021-11-09 DIAGNOSIS — R252 Cramp and spasm: Secondary | ICD-10-CM

## 2021-11-09 DIAGNOSIS — M542 Cervicalgia: Secondary | ICD-10-CM | POA: Diagnosis not present
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
## 2021-11-11 ENCOUNTER — Ambulatory Visit: Payer: BC Managed Care – PPO | Admitting: Orthopaedic Surgery

## 2021-11-11 ENCOUNTER — Ambulatory Visit: Payer: BC Managed Care – PPO

## 2021-11-11 ENCOUNTER — Encounter: Payer: Self-pay | Admitting: Orthopaedic Surgery

## 2021-11-11 DIAGNOSIS — M542 Cervicalgia: Secondary | ICD-10-CM | POA: Diagnosis not present

## 2021-11-11 DIAGNOSIS — F4323 Adjustment disorder with mixed anxiety and depressed mood: Secondary | ICD-10-CM | POA: Diagnosis not present

## 2021-11-11 DIAGNOSIS — R252 Cramp and spasm: Secondary | ICD-10-CM
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
## 2021-11-16 ENCOUNTER — Ambulatory Visit: Payer: BC Managed Care – PPO | Attending: Family Medicine

## 2021-11-16 DIAGNOSIS — F4323 Adjustment disorder with mixed anxiety and depressed mood: Secondary | ICD-10-CM | POA: Diagnosis not present

## 2021-11-16 DIAGNOSIS — R252 Cramp and spasm: Secondary | ICD-10-CM | POA: Insufficient documentation

## 2021-11-16 DIAGNOSIS — M542 Cervicalgia: Secondary | ICD-10-CM | POA: Diagnosis not present
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
## 2021-11-19 DIAGNOSIS — R002 Palpitations: Secondary | ICD-10-CM | POA: Diagnosis not present
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
## 2021-11-23 ENCOUNTER — Ambulatory Visit: Payer: BC Managed Care – PPO

## 2021-11-23 DIAGNOSIS — R252 Cramp and spasm: Secondary | ICD-10-CM

## 2021-11-23 DIAGNOSIS — M542 Cervicalgia: Secondary | ICD-10-CM | POA: Diagnosis not present

## 2021-11-24 DIAGNOSIS — F4323 Adjustment disorder with mixed anxiety and depressed mood: Secondary | ICD-10-CM | POA: Diagnosis not present

## 2021-12-01 DIAGNOSIS — F4323 Adjustment disorder with mixed anxiety and depressed mood: Secondary | ICD-10-CM | POA: Diagnosis not present

## 2021-12-03 ENCOUNTER — Encounter: Payer: Self-pay | Admitting: Family Medicine

## 2021-12-08 DIAGNOSIS — F4323 Adjustment disorder with mixed anxiety and depressed mood: Secondary | ICD-10-CM | POA: Diagnosis not present

## 2021-12-15 DIAGNOSIS — F4323 Adjustment disorder with mixed anxiety and depressed mood: Secondary | ICD-10-CM | POA: Diagnosis not present

## 2021-12-21 ENCOUNTER — Encounter: Payer: Self-pay | Admitting: *Deleted

## 2021-12-22 DIAGNOSIS — F4323 Adjustment disorder with mixed anxiety and depressed mood: Secondary | ICD-10-CM | POA: Diagnosis not present

## 2021-12-29 DIAGNOSIS — F4323 Adjustment disorder with mixed anxiety and depressed mood: Secondary | ICD-10-CM | POA: Diagnosis not present

## 2022-01-05 DIAGNOSIS — F4323 Adjustment disorder with mixed anxiety and depressed mood: Secondary | ICD-10-CM | POA: Diagnosis not present

## 2022-01-12 DIAGNOSIS — F4323 Adjustment disorder with mixed anxiety and depressed mood: Secondary | ICD-10-CM | POA: Diagnosis not present

## 2022-01-19 ENCOUNTER — Ambulatory Visit (INDEPENDENT_AMBULATORY_CARE_PROVIDER_SITE_OTHER): Payer: BC Managed Care – PPO

## 2022-01-19 ENCOUNTER — Encounter (HOSPITAL_COMMUNITY): Payer: Self-pay | Admitting: Emergency Medicine

## 2022-01-19 ENCOUNTER — Ambulatory Visit (HOSPITAL_COMMUNITY)
Admission: EM | Admit: 2022-01-19 | Discharge: 2022-01-19 | Disposition: A | Discharge status: Home / Self Care | Payer: BC Managed Care – PPO | Attending: Family Medicine | Admitting: Family Medicine

## 2022-01-19 DIAGNOSIS — M79644 Pain in right finger(s): Secondary | ICD-10-CM

## 2022-01-19 DIAGNOSIS — S6991XA Unspecified injury of right wrist, hand and finger(s), initial encounter: Secondary | ICD-10-CM | POA: Diagnosis not present

## 2022-01-19 MED ORDER — IBUPROFEN 800 MG PO TABS: 800.0000 mg | ORAL_TABLET | Freq: Three times a day (TID) | ORAL | 0 refills | Status: DC | PRN

## 2022-01-19 NOTE — ED Triage Notes (Signed)
Patient c/o finger injury that happened 5 days ago.   Patient endorses injury to RT middle and ringer finger.   Patient end[place]   Patient endorses increasing pain.   Patient endorses difficulty with bending fingers.   Patient has taken Advil with some relief of pain.
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
## 2022-01-19 NOTE — Discharge Instructions (Signed)
The radiology reading states there are no fractures.  I see a possible fracture on your distal phalanges on the third and fourth fingers.  Take ibuprofen 800 mg--1 tab every 8 hours as needed for pain.

## 2022-01-25 DIAGNOSIS — F4323 Adjustment disorder with mixed anxiety and depressed mood: Secondary | ICD-10-CM | POA: Diagnosis not present

## 2022-01-26 ENCOUNTER — Encounter: Payer: Self-pay | Admitting: Student

## 2022-01-27 NOTE — Telephone Encounter (Signed)
Called patient. Reports that she has been coughing a lot. Cough onset approx one week ago. States that she has been taking dayquil for management.   Denies current CP or SHOB. Patient is speaking in complete sentences.  States that yesterday she experienced "tightness in the middle of my chest." This only lasted for a few seconds and then went away.   Offered to schedule patient for next available with any provider. Patient opted to schedule with PCP for follow up and annual exam. This appointment is not until 8/16 (due to provider and patient availability)  Return precautions discussed with patient.   Please advise any additional recommendations for patient.   Veronda Prude, RN

## 2022-01-28 DIAGNOSIS — M79644 Pain in right finger(s): Secondary | ICD-10-CM | POA: Diagnosis not present

## 2022-02-02 DIAGNOSIS — F4323 Adjustment disorder with mixed anxiety and depressed mood: Secondary | ICD-10-CM | POA: Diagnosis not present

## 2022-02-09 DIAGNOSIS — F4323 Adjustment disorder with mixed anxiety and depressed mood: Secondary | ICD-10-CM | POA: Diagnosis not present

## 2022-02-11 ENCOUNTER — Encounter (HOSPITAL_COMMUNITY): Payer: Self-pay | Admitting: Emergency Medicine

## 2022-02-11 ENCOUNTER — Emergency Department (HOSPITAL_COMMUNITY)
Admission: EM | Admit: 2022-02-11 | Discharge: 2022-02-11 | Disposition: A | Discharge status: Home / Self Care | Payer: BC Managed Care – PPO | Attending: Emergency Medicine | Admitting: Emergency Medicine

## 2022-02-11 ENCOUNTER — Other Ambulatory Visit: Payer: Self-pay

## 2022-02-11 ENCOUNTER — Emergency Department (HOSPITAL_COMMUNITY): Payer: BC Managed Care – PPO

## 2022-02-11 DIAGNOSIS — M533 Sacrococcygeal disorders, not elsewhere classified: Secondary | ICD-10-CM | POA: Insufficient documentation

## 2022-02-11 DIAGNOSIS — W19XXXA Unspecified fall, initial encounter: Secondary | ICD-10-CM | POA: Insufficient documentation

## 2022-02-11 LAB — PREGNANCY, URINE: Preg Test, Ur: NEGATIVE

## 2022-02-11 MED ORDER — ONDANSETRON 4 MG PO TBDP
8.0000 mg | ORAL_TABLET | Freq: Once | ORAL | Status: DC
  Filled 2022-02-11: qty 2

## 2022-02-11 MED ORDER — IBUPROFEN 800 MG PO TABS: 800.0000 mg | ORAL_TABLET | Freq: Three times a day (TID) | ORAL | 0 refills | Status: DC

## 2022-02-11 MED ORDER — IBUPROFEN 800 MG PO TABS
800.0000 mg | ORAL_TABLET | Freq: Once | ORAL | Status: AC
  Administered 2022-02-11: 800 mg via ORAL
  Filled 2022-02-11: qty 1

## 2022-02-11 NOTE — ED Provider Notes (Signed)
Tonya Garza   CSN: [id number] Arrival date & time: 02/11/22  2029     History  Chief Complaint  Patient presents with   Fall ( L Buttocks Pain )     Tonya Garza is a 26 y.o. female.  The history is provided by the patient.  Fall This is a new problem. The current episode started 3 to 5 hours ago. The problem occurs constantly. The problem has not changed since onset.Pertinent negatives include no chest pain, no abdominal pain, no headaches and no shortness of breath. Nothing aggravates the symptoms. Nothing relieves the symptoms. She has tried nothing for the symptoms. The treatment provided no relief.  Fell onto buttock roller skating at a [employer].       Home Medications Prior to Admission medications   Medication Sig Start Date End Date Taking? Authorizing Provider  loratadine (CLARITIN) 10 MG tablet Take 10 mg by mouth daily as needed for allergies.   Yes [provider]  ibuprofen (ADVIL) 800 MG tablet Take 1 tablet (800 mg total) by mouth every 8 (eight) hours as needed (pain). Patient not taking: Reported on 02/11/2022 01/19/22   Zenia Resides, MD      Allergies    Patient has no known allergies.    Review of Systems   Review of Systems  Constitutional:  Negative for fever.  HENT:  Negative for facial swelling.   Eyes:  Negative for photophobia.  Respiratory:  Negative for shortness of breath.   Cardiovascular:  Negative for chest pain.  Gastrointestinal:  Negative for abdominal pain.  Genitourinary:  Negative for difficulty urinating.  Neurological:  Negative for headaches.  All other systems reviewed and are negative.   Physical Exam Updated Vital Signs BP 107/81 (BP Location: Right Arm)   Pulse 93   Temp 99.1 F (37.3 C) (Oral)   Resp 18   LMP 02/11/2022 (Exact Date)   SpO2 99%  Physical Exam Vitals and nursing Garza reviewed.  Constitutional:      General: She is not in acute  distress.    Appearance: Normal appearance. She is well-developed.  HENT:     Head: Normocephalic and atraumatic.     Nose: Nose normal.  Eyes:     Pupils: Pupils are equal, round, and reactive to light.  Cardiovascular:     Rate and Rhythm: Normal rate and regular rhythm.  Pulmonary:     Effort: Pulmonary effort is normal. No respiratory distress.     Breath sounds: Normal breath sounds.  Abdominal:     General: Abdomen is flat. Bowel sounds are normal. There is no distension.     Palpations: Abdomen is soft.     Tenderness: There is no abdominal tenderness. There is no guarding or rebound.  Genitourinary:    Vagina: No vaginal discharge.  Musculoskeletal:        General: Normal range of motion.     Cervical back: Normal, normal range of motion and neck supple.     Thoracic back: Normal.     Lumbar back: Normal.     Comments: Pelvis is stable   Skin:    General: Skin is warm.     Capillary Refill: Capillary refill takes less than 2 seconds.     Findings: No erythema or rash.  Neurological:     Mental Status: She is alert and oriented to person, place, and time.  Psychiatric:        Mood and  Affect: Mood normal.     ED Results / Procedures / Treatments   Labs (all labs ordered are listed, but only abnormal results are displayed) Labs Reviewed  PREGNANCY, URINE    EKG None  Radiology DG Lumbar Spine Complete  Result Date: 02/11/2022 CLINICAL DATA:  Fall, low back pain. EXAM: LUMBAR SPINE - COMPLETE 4+ VIEW COMPARISON:  None Available. FINDINGS: There is no evidence of lumbar spine fracture. Alignment is normal. Intervertebral disc spaces are maintained. IMPRESSION: Negative. Electronically Signed   By: Larose Hires D.O.   On: 02/11/2022 22:07   DG Hip Unilat W or Wo Pelvis 2-3 Views Left  Result Date: 02/11/2022 CLINICAL DATA:  Fall, low back pain. EXAM: DG HIP (WITH OR WITHOUT PELVIS) 2-3V LEFT COMPARISON:  None Available. FINDINGS: There is no evidence of hip  fracture or dislocation. There is no evidence of arthropathy or other focal bone abnormality. IMPRESSION: Negative. Electronically Signed   By: Larose Hires D.O.   On: 02/11/2022 22:06    Procedures Procedures    Medications Ordered in ED Medications  ondansetron (ZOFRAN-ODT) disintegrating tablet 8 mg (has no administration in time range)  ibuprofen (ADVIL) tablet 800 mg (has no administration in time range)    ED Course/ Medical Decision Making/ A&P                           Medical Decision Making Patient fell onto her bottom at a skate park   Amount and/or Complexity of Data Reviewed Independent Historian: spouse    Details: see above External Data Reviewed: notes.    Details: previous notes Labs: ordered.    Details: pregnancy is negative Radiology: ordered and independent interpretation performed.    Details: Xray negative for fracture  Risk Prescription drug management. Risk Details: No fractures.  Ice and NSAIDs.  Use a foam donut=,  soaking in a bath may be helpful.      Final Clinical Impression(s) / ED Diagnoses Final diagnoses:  None   Return for intractable cough, coughing up blood, fevers > 100.4 unrelieved by medication, shortness of breath, intractable vomiting, chest pain, shortness of breath, weakness, numbness, changes in speech, facial asymmetry, abdominal pain, passing out, Inability to tolerate liquids or food, cough, altered mental status or any concerns. No signs of systemic illness or infection. The patient is nontoxic-appearing on exam and vital signs are within normal limits.  I have reviewed the triage vital signs and the nursing notes. Pertinent labs & imaging results that were available during my care of the patient were reviewed by me and considered in my medical decision making (see chart for details). After history, exam, and medical workup I feel the patient has been appropriately medically screened and is safe for discharge home. Pertinent  diagnoses were discussed with the patient. Patient was given return precautions.  Rx / DC Orders ED Discharge Orders     None         Tianah Lonardo, MD 02/11/22 [id number]

## 2022-02-11 NOTE — ED Triage Notes (Signed)
Patient lost her balance and fell while skating this evening , no LOC / ambulatory , reports pain at left buttocks with nausea .

## 2022-02-11 NOTE — ED Notes (Signed)
Patient verbalizes understanding of discharge instructions. Opportunity for questioning and answers were provided. Armband removed by staff, pt discharged from ED. Ambulated out to lobby  

## 2022-02-11 NOTE — ED Provider Triage Note (Signed)
Emergency Medicine Provider Triage Evaluation Note  Tonya Garza , a 26 y.o. female  was evaluated in triage.  Pt complains of fall, left hip and low back pain. She states that earlier today she was doing some sort of extreme roller skating that involves high jumps and she fell off a 3 foot ledge onto her left buttock. She states that the fall sent a jolt up her back and knocked the breath out of her and for about 15 minutes she felt like she might pass out but then it went away. She now states that she is having pain deep in her left buttock. States that she is concerned there is an injury where her pelvis connects to her lower back. She has been ambulatory since the incident. Denies any loss of bowel or bladder function or saddle paraesthesias. No sharp shooting pain down her legs or numbness/tingling in her extremities  Review of Systems  Positive:  Negative:   Physical Exam  BP 107/81 (BP Location: Right Arm)   Pulse 93   Temp 99.1 F (37.3 C) (Oral)   Resp 18   LMP 02/11/2022 (Exact Date)   SpO2 99%  Gen:   Awake, no distress   Resp:  Normal effort MSK:   Moves extremities without difficulty  Other:    Medical Decision Making  Medically screening exam initiated at 9:23 PM.  Appropriate orders placed.  Adelita El-Khouri was informed that the remainder of the evaluation will be completed by another provider, this initial triage assessment does not replace that evaluation, and the importance of remaining in the ED until their evaluation is complete.     Vear Clock 02/11/22 [id number]

## 2022-02-13 ENCOUNTER — Encounter: Payer: Self-pay | Admitting: Student

## 2022-02-16 NOTE — Telephone Encounter (Signed)
Called patient without response and left a generic voice mail.

## 2022-03-02 ENCOUNTER — Ambulatory Visit (INDEPENDENT_AMBULATORY_CARE_PROVIDER_SITE_OTHER): Payer: No Typology Code available for payment source | Admitting: Student

## 2022-03-02 ENCOUNTER — Encounter: Payer: Self-pay | Admitting: Student

## 2022-03-02 VITALS — BP 113/70 | HR 82 | Ht 67.0 in | Wt 146.0 lb

## 2022-03-02 DIAGNOSIS — Z0000 Encounter for general adult medical examination without abnormal findings: Secondary | ICD-10-CM | POA: Diagnosis not present

## 2022-03-02 NOTE — Progress Notes (Signed)
    SUBJECTIVE:   Chief compliant/HPI: annual examination  Tonya Garza is a 26 y.o. who presents today for an annual exam.   No medical concern today. Mild numbness on the left buttocks from a fall over 2 weeks ago and is improved. Very active: walking, skating on ramp. Diet: regular diet and no restriction Exercise: daily with walk or skating Sleep: About 7 hours Social: Work as Scientific laboratory technician, social drinker. Denies use of tobacco or illicit drugs.  Sex: Yes with one partner and IUD 2.5 yrs.  Menses: on IUD for 2.14yrs with occasional spotting about twice a month   Reviewed and updated history .   Review of systems form unremarkable   OBJECTIVE:   BP 113/70   Pulse 82   Ht 5\' 7"  (1.702 m)   Wt 146 lb (66.2 kg)   LMP 02/11/2022 (Exact Date)   SpO2 100%   BMI 22.87 kg/m     General: Alert, well appearing, NAD HEENT: Atraumatic, MMM, No sclera icterus CV: RRR, no murmurs, normal S1/S2 Pulm: CTAB, good WOB on RA, no crackles or wheezing Abd: Soft, no distension, no tenderness Skin: dry, warm Ext: No BLE edema, +2 Pedal and radial pulse.   ASSESSMENT/PLAN:   Annual Examination  See AVS for age appropriate recommendations  PHQ score 2, reviewed and discussed.  Blood pressure reviewed and at goal.     Considered the following items based upon USPSTF recommendations: HIV testing: not indicated Hepatitis C: not indicated Hepatitis B: undecided Syphilis if at high risk: {not indicated GC/CTnot indicated Lipid panel (nonfasting or fasting) discussed based upon AHA recommendations and not ordered.  Consider repeat every 4-6 years.  Reviewed risk factors for latent tuberculosis and not indicated Immunizations None   Follow up in 1 year or sooner if indicated.    02/13/2022, MD [facility]

## 2022-03-02 NOTE — Patient Instructions (Signed)
It was wonderful to meet you today. Thank you for allowing me to be a part of your care. Below is a short summary of what we discussed at your visit today:  Overall healthy. No concerns today  Use warm compress on the swelling on your Left buttock. That's a hematoma that should clear slowly with time.  If you have any questions or concerns, please do not hesitate to contact us via phone or MyChart message.   Jerre Simon, MD [facility]

## 2022-07-19 DIAGNOSIS — F4323 Adjustment disorder with mixed anxiety and depressed mood: Secondary | ICD-10-CM | POA: Diagnosis not present

## 2022-07-26 DIAGNOSIS — F4323 Adjustment disorder with mixed anxiety and depressed mood: Secondary | ICD-10-CM | POA: Diagnosis not present

## 2022-08-09 DIAGNOSIS — F4323 Adjustment disorder with mixed anxiety and depressed mood: Secondary | ICD-10-CM | POA: Diagnosis not present

## 2022-08-23 DIAGNOSIS — F4323 Adjustment disorder with mixed anxiety and depressed mood: Secondary | ICD-10-CM | POA: Diagnosis not present

## 2022-08-30 DIAGNOSIS — F4323 Adjustment disorder with mixed anxiety and depressed mood: Secondary | ICD-10-CM | POA: Diagnosis not present

## 2022-09-06 DIAGNOSIS — F4323 Adjustment disorder with mixed anxiety and depressed mood: Secondary | ICD-10-CM | POA: Diagnosis not present

## 2022-09-13 DIAGNOSIS — F4323 Adjustment disorder with mixed anxiety and depressed mood: Secondary | ICD-10-CM | POA: Diagnosis not present

## 2022-09-20 DIAGNOSIS — F4323 Adjustment disorder with mixed anxiety and depressed mood: Secondary | ICD-10-CM | POA: Diagnosis not present

## 2022-09-26 DIAGNOSIS — F4323 Adjustment disorder with mixed anxiety and depressed mood: Secondary | ICD-10-CM | POA: Diagnosis not present

## 2022-10-04 DIAGNOSIS — F4323 Adjustment disorder with mixed anxiety and depressed mood: Secondary | ICD-10-CM | POA: Diagnosis not present

## 2022-10-11 DIAGNOSIS — F4323 Adjustment disorder with mixed anxiety and depressed mood: Secondary | ICD-10-CM | POA: Diagnosis not present

## 2022-10-18 DIAGNOSIS — F4323 Adjustment disorder with mixed anxiety and depressed mood: Secondary | ICD-10-CM | POA: Diagnosis not present

## 2022-10-25 DIAGNOSIS — F4323 Adjustment disorder with mixed anxiety and depressed mood: Secondary | ICD-10-CM | POA: Diagnosis not present

## 2022-10-31 DIAGNOSIS — F4323 Adjustment disorder with mixed anxiety and depressed mood: Secondary | ICD-10-CM | POA: Diagnosis not present

## 2022-11-07 DIAGNOSIS — F4323 Adjustment disorder with mixed anxiety and depressed mood: Secondary | ICD-10-CM | POA: Diagnosis not present

## 2022-11-13 ENCOUNTER — Encounter: Payer: Self-pay | Admitting: Student

## 2022-11-14 ENCOUNTER — Ambulatory Visit (INDEPENDENT_AMBULATORY_CARE_PROVIDER_SITE_OTHER): Payer: 59 | Admitting: Family Medicine

## 2022-11-14 VITALS — BP 103/64 | HR 71 | Temp 98.4°F | Ht 67.0 in | Wt 144.2 lb

## 2022-11-14 DIAGNOSIS — F4323 Adjustment disorder with mixed anxiety and depressed mood: Secondary | ICD-10-CM | POA: Diagnosis not present

## 2022-11-14 DIAGNOSIS — K219 Gastro-esophageal reflux disease without esophagitis: Secondary | ICD-10-CM

## 2022-11-14 DIAGNOSIS — R1013 Epigastric pain: Secondary | ICD-10-CM | POA: Diagnosis not present

## 2022-11-14 MED ORDER — OMEPRAZOLE 20 MG PO CPDR: 20.0000 mg | DELAYED_RELEASE_CAPSULE | Freq: Every day | ORAL | 0 refills | Status: DC

## 2022-11-14 NOTE — Progress Notes (Signed)
    SUBJECTIVE:   CHIEF COMPLAINT / HPI:   Patient presents with abdominal pain that started 2 weeks ago, prior to this time period she never had a pain similar to this in the past. Pain seems to be intermittent, describes the pain as a sharp, burning sensation along the epigastric region, denies radiating pain. Denies any associated pattern to pain onset. Denies any noticeable aggravating or relieving factors. Typically pain spontaneously resolves within a few minutes. Has not tried anything for this. Denies reflux sensation, dysphagia, hemoptysis, unintentional weight loss, hematochezia or melena. Has BM daily, soft and formed. Denies any new foods recently. Denies chronic NSAID use.   OBJECTIVE:   BP 103/64   Pulse 71   Temp 98.4 F (36.9 C)   Ht 5\' 7"  (1.702 m)   Wt 144 lb 3.2 oz (65.4 kg)   LMP 11/01/2022   SpO2 100%   BMI 22.58 kg/m   General: Patient well-appearing, in no acute distress. CV: RRR, no murmurs or gallops auscultated Resp: CTAB, no wheezing, rales or rhonchi noted Abdomen: soft, nontender, nondistended, presence of bowel sounds, no rebound tenderness or guarding noted, negative McBurney's  Psych: mood appropriate, pleasant  ASSESSMENT/PLAN:   Epigastric pain -abdominal exam unremarkable, concern for GERD. Low suspicion for pancreatitis, H pylori and appendicitis given history and exam. Reassuringly no red flag symptoms, no indication for EGD at this time.  -trial omeprazole 20 mg, patient hesitant to take this daily so recommended as needed when symptoms worsen -encouraged to maintain food diary to identify triggers -GERD precautions discussed and handout provided -follow up in 1 month with PCP to monitor progression of symptoms, consider lipase and CMP if pain fails to improve at next visit     -PHQ-9 score of 2 with negative question 9 reviewed.    Reece Leader, DO [facility]

## 2022-11-14 NOTE — Assessment & Plan Note (Signed)
-  abdominal exam unremarkable, concern for GERD. Low suspicion for pancreatitis, H pylori and appendicitis given history and exam. Reassuringly no red flag symptoms, no indication for EGD at this time.  -trial omeprazole 20 mg, patient hesitant to take this daily so recommended as needed when symptoms worsen -encouraged to maintain food diary to identify triggers -GERD precautions discussed and handout provided -follow up in 1 month with PCP to monitor progression of symptoms, consider lipase and CMP if pain fails to improve at next visit

## 2022-11-14 NOTE — Patient Instructions (Signed)
It was great seeing you today!  Today we discussed your symptoms, it seems like you have gastroesophageal reflux. We can try omeprazole 20 mg daily as needed to take when your symptoms are worse. Please maintain a food diary to identify triggers. Try to avoid spicy foods, alcohol and other things that can worsen your symptoms.   Please follow up at your next scheduled appointment in 1 month, if anything arises between now and then, please don't hesitate to contact our office.   Thank you for allowing [place] to be a part of your medical care!  Thank you, Dr. Robyne Peers
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
## 2022-12-05 ENCOUNTER — Ambulatory Visit: Payer: 59 | Admitting: Student

## 2022-12-12 DIAGNOSIS — F4323 Adjustment disorder with mixed anxiety and depressed mood: Secondary | ICD-10-CM | POA: Diagnosis not present

## 2022-12-14 ENCOUNTER — Ambulatory Visit
Admission: RE | Admit: 2022-12-14 | Discharge: 2022-12-14 | Disposition: A | Discharge status: Home / Self Care | Payer: 59 | Source: Ambulatory Visit | Attending: Family Medicine | Admitting: Family Medicine

## 2022-12-14 ENCOUNTER — Ambulatory Visit (INDEPENDENT_AMBULATORY_CARE_PROVIDER_SITE_OTHER): Payer: 59 | Admitting: Family Medicine

## 2022-12-14 ENCOUNTER — Encounter: Payer: Self-pay | Admitting: Family Medicine

## 2022-12-14 VITALS — BP 100/60 | HR 68 | Ht 67.0 in | Wt 145.5 lb

## 2022-12-14 DIAGNOSIS — M25521 Pain in right elbow: Secondary | ICD-10-CM

## 2022-12-14 NOTE — Assessment & Plan Note (Signed)
Patient with acute injury 1 week ago with continued medial epicondyle pain.  Given the persistence of pain and location, will obtain x-rays to ensure no fracture or avulsion of the area. - DG elbow 3 views

## 2022-12-14 NOTE — Patient Instructions (Signed)
I have ordered elbow x-rays that you can walk in and have done at [address]. You can use Tylenol or Ibuprofen as needed.

## 2022-12-14 NOTE — Progress Notes (Signed)
    SUBJECTIVE:   CHIEF COMPLAINT / HPI:   Elbow pain with fall - Fell 1 week ago and has had continued pain - Swelling has improved and hasn't required anything for the pain - Notes that she had a fracture in the past ni her fingers with similar bone feeling pain - Describes it as a bone throbbing pain - Has been putting neosporin on it due to the scrape with improvement  PERTINENT  PMH / PSH: Reviewed  OBJECTIVE:   BP 100/60   Pulse 68   Ht 5\' 7"  (1.702 m)   Wt 145 lb 8 oz (66 kg)   LMP 11/27/2022   SpO2 97%   BMI 22.79 kg/m   General: NAD, well-appearing, well-nourished Respiratory: No respiratory distress, breathing comfortably, able to speak in full sentences Skin: warm and dry, no rashes noted on exposed skin Psych: Appropriate affect and mood Elbow: - Inspection: no obvious deformity. Bruising with scab present on the distal medial elbow region - Palpation: TTP along the medial epicondyle  - ROM: full active ROM in flexion and extension. No crepitus - Strength: 5/5 strength in wrist flexion and extension without pain. 5/5 strength in biceps, triceps. - Neuro: NV intact distally - Special testing: no laxity with varus/valgus stress  ASSESSMENT/PLAN:   Elbow pain, right Patient with acute injury 1 week ago with continued medial epicondyle pain.  Given the persistence of pain and location, will obtain x-rays to ensure no fracture or avulsion of the area. - DG elbow 3 views     Evelena Leyden, DO [facility]

## 2022-12-16 ENCOUNTER — Encounter: Payer: Self-pay | Admitting: Family Medicine

## 2022-12-27 ENCOUNTER — Ambulatory Visit: Payer: 59 | Admitting: Skilled Nursing Facility1

## 2022-12-28 ENCOUNTER — Encounter: Payer: 59 | Attending: Family Medicine | Admitting: Skilled Nursing Facility1

## 2022-12-28 ENCOUNTER — Encounter: Payer: Self-pay | Admitting: Skilled Nursing Facility1

## 2022-12-28 DIAGNOSIS — E631 Imbalance of constituents of food intake: Secondary | ICD-10-CM | POA: Insufficient documentation

## 2022-12-28 NOTE — Progress Notes (Signed)
Medical Nutrition Therapy   [employer] employee  NUTRITION ASSESSMENT    Clinical Medical Hx: N/A Medications: N/A Labs:  Notable Signs/Symptoms: none reported   Lifestyle & Dietary Hx  Pt states she enjoys a fulfilled life.  Estimated daily fluid intake: 80+ oz Supplements: N/A Sleep: 8+ hours Stress / self-care: 1 on a scale of 1-10 Current average weekly physical activity: 300 minutes a week; gym and roller skating   24-Hr Dietary Recall First Meal: eggs + brown rice + peppers + onions Snack:  Second Meal: lentils + broccoli Snack: apple + peanut butter Third Meal: chicken + chic pea + cabbage Snack:  Beverages: coffee, water   NUTRITION INTERVENTION  Nutrition education (E-1) on the following topics:  Encouraged patient to continue with her balanced diet and meeting the physical activity recommendations each week  Learning Style & Readiness for Change Teaching method utilized: Visual & Auditory  Demonstrated degree of understanding via: Teach Back  Barriers to learning/adherence to lifestyle change: none identified   Goals Established by Pt Continue to live your best life    MONITORING & EVALUATION Dietary intake, weekly physical activity  Next Steps  Patient is to follow up for second appt.

## 2023-01-02 ENCOUNTER — Ambulatory Visit: Payer: 59 | Admitting: Skilled Nursing Facility1

## 2023-01-02 DIAGNOSIS — F4323 Adjustment disorder with mixed anxiety and depressed mood: Secondary | ICD-10-CM | POA: Diagnosis not present

## 2023-01-03 ENCOUNTER — Encounter: Payer: 59 | Admitting: Skilled Nursing Facility1

## 2023-01-03 DIAGNOSIS — E631 Imbalance of constituents of food intake: Secondary | ICD-10-CM

## 2023-01-03 NOTE — Progress Notes (Signed)
Medical Nutrition Therapy   [employer] employee  NUTRITION ASSESSMENT    Clinical Medical Hx: N/A Medications: N/A Labs:  Notable Signs/Symptoms: none reported   Lifestyle & Dietary Hx  Pt arrives stating she had a great vacation and looks forward to continuing to do right by her body.   Estimated daily fluid intake: 80+ oz Supplements: N/A Sleep: 8+ hours Stress / self-care: 1 on a scale of 1-10 Current average weekly physical activity: 300 minutes a week; gym and roller skating   24-Hr Dietary Recall: continued First Meal: eggs + brown rice + peppers + onions Snack:  Second Meal: lentils + broccoli Snack: apple + peanut butter Third Meal: chicken + chic pea + cabbage or tofu and brown rice with broccoli Snack:  Beverages: coffee, water   NUTRITION INTERVENTION  Nutrition education (E-1) on the following topics: continue Encouraged patient to continue with her balanced diet and meeting the physical activity recommendations each week  Learning Style & Readiness for Change Teaching method utilized: Visual & Auditory  Demonstrated degree of understanding via: Teach Back  Barriers to learning/adherence to lifestyle change: none identified   Goals Established by Pt Continue to live your best life    MONITORING & EVALUATION Dietary intake, weekly physical activity  Next Steps  Patient is to follow up as needed

## 2023-01-09 DIAGNOSIS — F4323 Adjustment disorder with mixed anxiety and depressed mood: Secondary | ICD-10-CM | POA: Diagnosis not present

## 2023-01-23 DIAGNOSIS — F4323 Adjustment disorder with mixed anxiety and depressed mood: Secondary | ICD-10-CM | POA: Diagnosis not present

## 2023-01-25 ENCOUNTER — Other Ambulatory Visit: Payer: Self-pay | Admitting: Oncology

## 2023-01-25 DIAGNOSIS — Z006 Encounter for examination for normal comparison and control in clinical research program: Secondary | ICD-10-CM

## 2023-01-30 DIAGNOSIS — F4323 Adjustment disorder with mixed anxiety and depressed mood: Secondary | ICD-10-CM | POA: Diagnosis not present

## 2023-02-06 DIAGNOSIS — F4323 Adjustment disorder with mixed anxiety and depressed mood: Secondary | ICD-10-CM | POA: Diagnosis not present

## 2023-02-13 DIAGNOSIS — F4323 Adjustment disorder with mixed anxiety and depressed mood: Secondary | ICD-10-CM | POA: Diagnosis not present

## 2023-02-16 ENCOUNTER — Other Ambulatory Visit (HOSPITAL_COMMUNITY)
Admission: RE | Admit: 2023-02-16 | Discharge: 2023-02-16 | Disposition: A | Discharge status: Home / Self Care | Payer: 59 | Source: Ambulatory Visit | Attending: Oncology | Admitting: Oncology

## 2023-02-16 DIAGNOSIS — Z006 Encounter for examination for normal comparison and control in clinical research program: Secondary | ICD-10-CM | POA: Insufficient documentation

## 2023-02-20 DIAGNOSIS — F4323 Adjustment disorder with mixed anxiety and depressed mood: Secondary | ICD-10-CM | POA: Diagnosis not present

## 2023-02-27 DIAGNOSIS — F4323 Adjustment disorder with mixed anxiety and depressed mood: Secondary | ICD-10-CM | POA: Diagnosis not present

## 2023-03-06 ENCOUNTER — Ambulatory Visit (INDEPENDENT_AMBULATORY_CARE_PROVIDER_SITE_OTHER): Payer: 59 | Admitting: Student

## 2023-03-06 VITALS — BP 105/73 | HR 72 | Temp 98.1°F | Resp 16 | Wt 147.0 lb

## 2023-03-06 DIAGNOSIS — Z0000 Encounter for general adult medical examination without abnormal findings: Secondary | ICD-10-CM | POA: Diagnosis not present

## 2023-03-06 NOTE — Patient Instructions (Addendum)
It was wonderful to see you today. Thank you for allowing me to be a part of your care. Below is a short summary of what we discussed at your visit today:  Overall doing well.  No concerns today.  We ordered labs for CBC, BMP and lipid panel which you completes next week 8/26 at 845am   If you have any questions or concerns, please do not hesitate to contact us via phone or MyChart message.   Jerre Simon, MD [facility]

## 2023-03-06 NOTE — Progress Notes (Signed)
[facility]  Annual Wellness Visit     Patient: Tonya Garza, Female    DOB: 11-28-1995, 27 y.o.   MRN: [id number]  Subjective  Chief Complaint  Patient presents with   Annual Exam    Tonya Garza is a 27 y.o. female who presents today for her Annual Wellness Visit.   Diet: Regular Sleep: Falls asleep and sleeps through the night. Always wakes  Exercise: Daily, skating, rock climbing, running Alcohol use:  Occassinally Tobacco use: Never  Illicit drug [id number]  Sexually active: yes, 1 female partner  Works as: Scientific laboratory technician  Lives with:Alone  Code Status: Full  Power of Attorney: Mother   Medical concerns: M medical concerns   Medications: Outpatient Medications Prior to Visit  Medication Sig   levonorgestrel (MIRENA) 20 MCG/DAY IUD    [DISCONTINUED] ibuprofen (ADVIL) 800 MG tablet Take 1 tablet (800 mg total) by mouth every 8 (eight) hours as needed (pain). (Patient not taking: Reported on 02/11/2022)   [DISCONTINUED] ibuprofen (ADVIL) 800 MG tablet Take 1 tablet (800 mg total) by mouth 3 (three) times daily.   [DISCONTINUED] loratadine (CLARITIN) 10 MG tablet Take 10 mg by mouth daily as needed for allergies. (Patient not taking: Reported on 03/06/2023)   [DISCONTINUED] omeprazole (PRILOSEC) 20 MG capsule Take 1 capsule (20 mg total) by mouth daily. Please take 1 tablet daily as needed.   No facility-administered medications prior to visit.      Objective  BP 105/73   Pulse 72   Temp 98.1 F (36.7 C) (Oral)   Resp 16   Wt 66.7 kg   SpO2 100%   BMI 23.02 kg/m   General: Alert, well appearing, NAD HEENT: Atraumatic, MMM, No sclera icterus CV: RRR, no murmurs, normal S1/S2 Pulm: CTAB, good WOB on RA, no crackles or wheezing Abd: Soft, no distension, no tenderness Skin: dry, warm Ext: No BLE edema, +2 Pedal and radial pulse.      03/06/2023    3:22 PM 12/14/2022   10:38 AM  PHQ 2/9 Scores  PHQ - 2 Score 0 0  PHQ- 9 Score 0 1   Assessment & Plan    Overall healthy 27 year old female with no medical concerns today. Would like to have blood work. She is currently not fasting and expressed preference to come at a later date to have her labs drawn. Future labs for CBC, BMP and Lipid Panel ordered.  Annual wellness visit done today including the all of the following: Reviewed patient's Family Medical History Reviewed and updated list of patient's medical providers Assessment of cognitive impairment was done Assessed patient's functional ability Established a written schedule for health screening services Health Risk Assessent Completed and Reviewed  Exercise Activities and Dietary recommendations  Goals   None     Immunization History  Administered Date(s) Administered   HPV 9-valent 06/21/2019, 07/31/2019, 12/30/2019   Influenza-Unspecified 05/12/2020   Janssen (J&J) SARS-COV-2 Vaccination 10/24/2019   Td 04/07/2010, 12/24/2015   Tdap 04/07/2010    Health Maintenance  Topic Date Due   COVID-19 Vaccine (2 - 2023-24 season) 03/18/2022   INFLUENZA VACCINE  02/16/2023   PAP-Cervical Cytology Screening  08/20/2024   PAP SMEAR-Modifier  08/20/2024   DTaP/Tdap/Td (3 - Tdap) 12/23/2025   HPV VACCINES  Completed   Hepatitis C Screening  Completed   HIV Screening  Completed     Discussed health benefits of physical activity, and encouraged her to engage in regular exercise appropriate for her age and condition.  Problem List Items Addressed This Visit       Other   Well adult health check - Primary   Relevant Orders   CBC   Lipid Panel   Basic metabolic panel    No follow-ups on file.     Jerre Simon, MD

## 2023-03-06 NOTE — Progress Notes (Signed)
-  Patient is here to have annually  complete physical examination  -Care gap address -labs taken  

## 2023-03-13 ENCOUNTER — Other Ambulatory Visit: Payer: 59

## 2023-03-13 ENCOUNTER — Telehealth: Payer: Self-pay

## 2023-03-13 DIAGNOSIS — Z0000 Encounter for general adult medical examination without abnormal findings: Secondary | ICD-10-CM

## 2023-03-13 DIAGNOSIS — F4323 Adjustment disorder with mixed anxiety and depressed mood: Secondary | ICD-10-CM | POA: Diagnosis not present

## 2023-03-14 LAB — LIPID PANEL
Chol/HDL Ratio: 2.7 ratio (ref 0.0–4.4)
Cholesterol, Total: 165 mg/dL (ref 100–199)
HDL: 61 mg/dL (ref >39)
LDL Chol Calc (NIH): 96 mg/dL (ref 0–99)
Triglycerides: 35 mg/dL (ref 0–149)
VLDL Cholesterol Cal: 8 mg/dL (ref 5–40)

## 2023-03-14 LAB — CBC
Hematocrit: 39.1 % (ref 34.0–46.6)
Hemoglobin: 12.9 g/dL (ref 11.1–15.9)
MCH: 30.7 pg (ref 26.6–33.0)
MCHC: 33 g/dL (ref 31.5–35.7)
MCV: 93 fL (ref 79–97)
Platelets: 208 10*3/uL (ref 150–450)
RBC: 4.2 x10E6/uL (ref 3.77–5.28)
RDW: 12.9 % (ref 11.7–15.4)
WBC: 4.5 10*3/uL (ref 3.4–10.8)

## 2023-03-14 LAB — BASIC METABOLIC PANEL
BUN/Creatinine Ratio: 16 (ref 9–23)
BUN: 10 mg/dL (ref 6–20)
CO2: 22 mmol/L (ref 20–29)
Calcium: 9.5 mg/dL (ref 8.7–10.2)
Chloride: 103 mmol/L (ref 96–106)
Creatinine, Ser: 0.62 mg/dL (ref 0.57–1.00)
Glucose: 90 mg/dL (ref 70–99)
Potassium: 4.7 mmol/L (ref 3.5–5.2)
Sodium: 138 mmol/L (ref 134–144)
eGFR: 127 mL/min/{1.73_m2} (ref >59)

## 2023-03-16 ENCOUNTER — Encounter: Payer: Self-pay | Admitting: Student

## 2023-03-23 DIAGNOSIS — F4323 Adjustment disorder with mixed anxiety and depressed mood: Secondary | ICD-10-CM | POA: Diagnosis not present

## 2023-04-03 DIAGNOSIS — F4323 Adjustment disorder with mixed anxiety and depressed mood: Secondary | ICD-10-CM | POA: Diagnosis not present

## 2023-04-10 DIAGNOSIS — F4323 Adjustment disorder with mixed anxiety and depressed mood: Secondary | ICD-10-CM | POA: Diagnosis not present

## 2023-04-17 DIAGNOSIS — F4323 Adjustment disorder with mixed anxiety and depressed mood: Secondary | ICD-10-CM | POA: Diagnosis not present

## 2023-04-24 DIAGNOSIS — F4323 Adjustment disorder with mixed anxiety and depressed mood: Secondary | ICD-10-CM | POA: Diagnosis not present

## 2023-05-01 DIAGNOSIS — F4323 Adjustment disorder with mixed anxiety and depressed mood: Secondary | ICD-10-CM | POA: Diagnosis not present

## 2023-05-11 DIAGNOSIS — F4323 Adjustment disorder with mixed anxiety and depressed mood: Secondary | ICD-10-CM | POA: Diagnosis not present

## 2023-05-15 DIAGNOSIS — F4323 Adjustment disorder with mixed anxiety and depressed mood: Secondary | ICD-10-CM | POA: Diagnosis not present

## 2023-05-22 DIAGNOSIS — F4323 Adjustment disorder with mixed anxiety and depressed mood: Secondary | ICD-10-CM | POA: Diagnosis not present

## 2023-05-29 ENCOUNTER — Encounter: Payer: Self-pay | Admitting: Student

## 2023-05-29 ENCOUNTER — Ambulatory Visit (INDEPENDENT_AMBULATORY_CARE_PROVIDER_SITE_OTHER): Payer: 59 | Admitting: Student

## 2023-05-29 VITALS — BP 113/65 | HR 74 | Ht 67.0 in | Wt 144.8 lb

## 2023-05-29 DIAGNOSIS — R195 Other fecal abnormalities: Secondary | ICD-10-CM | POA: Diagnosis not present

## 2023-05-29 DIAGNOSIS — Z0110 Encounter for examination of ears and hearing without abnormal findings: Secondary | ICD-10-CM

## 2023-05-29 DIAGNOSIS — F4323 Adjustment disorder with mixed anxiety and depressed mood: Secondary | ICD-10-CM | POA: Diagnosis not present

## 2023-05-29 NOTE — Patient Instructions (Signed)
It was wonderful to SEE you today. Thank you for allowing me to be a part of your care. Below is a short summary of what we discussed at your visit today:  Your bowel habits could be due to alterations in your gastric flora.  However given that you have not had any blood in your stool abdominal pain associated with this I would say continue monitoring your diet.  If the loose stool continues or gets worse please reach out and we can put in referral to see a GI doctor.  If you notice any blood in your stool please schedule appointment to be seen immediately.  Have sent in referral to an ENT for issues with hearing.  If you have any questions or concerns, please do not hesitate to contact us via phone or MyChart message.   Jerre Simon, MD [facility]

## 2023-05-29 NOTE — Progress Notes (Unsigned)
    SUBJECTIVE:   CHIEF COMPLAINT / HPI:   Patient is a 27 year old female presenting today due to diarrhea She reports she is continue to have loose stool since the beginning of the year Abx earlier in the year and gastroenteritis earlier in the year [place] No abdominal pain or blood in stool No special or recent  change in diet.  Denies any change in medication BM is twice a day and reported to be type 5 stool No hx of constipation Eat lots of fiber including vegetable, fruits, oat meal.   Hearing impairment Expressed concerns for hearing impairment Over the past year.  Impairment is more prominent in a noisy environment Denies any ringing in the ear or dizziness. No headaches.   PERTINENT  PMH / PSH: Reviewed   OBJECTIVE:   BP 113/65   Pulse 74   Ht 5\' 7"  (1.702 m)   Wt 144 lb 12.8 oz (65.7 kg)   SpO2 100%   BMI 22.68 kg/m    Physical Exam General: Alert, well appearing, NAD Cardiovascular: RRR, No Murmurs, Normal S2/S2 Respiratory: CTAB, No wheezing or Rales Abdomen: No distension or tenderness. No rebound or guarding. Extremities: No edema on extremities   Skin: Warm and dry  ASSESSMENT/PLAN:   Diarrhea Unclear cause of patient's bowel habit.  No red flag symptoms. Suspect symptoms could be due to change in bowel flora post GI bug/antibiotics use or increase fiber diet as described by patient.  Via shared decision patient will like to continue monitoring bowel habits with no workup at this time. Discussed possibility of GI referral and patient would like to hold off at this time.  Return precaution discussed with patient who verbalized understanding and agreeable to plan.  Hearing loss Given patient's report of inability to distinguish surrounding  noise during conversation is suspicious of sensorineural hearing loss.  On exam TM membrane intact bilaterally. -Placed referral to ENT for further  assessment.     Jerre Simon, MD [facility]

## 2023-05-30 ENCOUNTER — Encounter: Payer: Self-pay | Admitting: Student

## 2023-06-05 DIAGNOSIS — Z309 Encounter for contraceptive management, unspecified: Secondary | ICD-10-CM | POA: Diagnosis not present

## 2023-06-05 DIAGNOSIS — Z6822 Body mass index (BMI) 22.0-22.9, adult: Secondary | ICD-10-CM | POA: Diagnosis not present

## 2023-06-05 DIAGNOSIS — F4323 Adjustment disorder with mixed anxiety and depressed mood: Secondary | ICD-10-CM | POA: Diagnosis not present

## 2023-06-05 DIAGNOSIS — Z01419 Encounter for gynecological examination (general) (routine) without abnormal findings: Secondary | ICD-10-CM | POA: Diagnosis not present

## 2023-06-05 DIAGNOSIS — Z113 Encounter for screening for infections with a predominantly sexual mode of transmission: Secondary | ICD-10-CM | POA: Diagnosis not present

## 2023-06-06 ENCOUNTER — Encounter: Payer: Self-pay | Admitting: Student

## 2023-06-07 ENCOUNTER — Encounter (INDEPENDENT_AMBULATORY_CARE_PROVIDER_SITE_OTHER): Payer: Self-pay | Admitting: Otolaryngology

## 2023-06-22 DIAGNOSIS — F4323 Adjustment disorder with mixed anxiety and depressed mood: Secondary | ICD-10-CM | POA: Diagnosis not present

## 2023-06-29 DIAGNOSIS — F4323 Adjustment disorder with mixed anxiety and depressed mood: Secondary | ICD-10-CM | POA: Diagnosis not present

## 2023-07-03 DIAGNOSIS — F4323 Adjustment disorder with mixed anxiety and depressed mood: Secondary | ICD-10-CM | POA: Diagnosis not present

## 2023-07-17 DIAGNOSIS — F4323 Adjustment disorder with mixed anxiety and depressed mood: Secondary | ICD-10-CM | POA: Diagnosis not present

## 2023-07-21 ENCOUNTER — Telehealth (INDEPENDENT_AMBULATORY_CARE_PROVIDER_SITE_OTHER): Payer: Self-pay | Admitting: Otolaryngology

## 2023-07-21 NOTE — Telephone Encounter (Signed)
 Called patient, left VM regarding appt date, time and location.   07/24/2023 Status: [address] [place], [place] [address]  Time: 9:30 AM

## 2023-07-24 ENCOUNTER — Ambulatory Visit (INDEPENDENT_AMBULATORY_CARE_PROVIDER_SITE_OTHER): Payer: 59 | Admitting: Otolaryngology

## 2023-07-24 ENCOUNTER — Encounter (INDEPENDENT_AMBULATORY_CARE_PROVIDER_SITE_OTHER): Payer: Self-pay

## 2023-07-24 VITALS — BP 113/77 | HR 65 | Resp 19 | Ht 67.0 in | Wt 144.0 lb

## 2023-07-24 DIAGNOSIS — H9190 Unspecified hearing loss, unspecified ear: Secondary | ICD-10-CM

## 2023-07-24 NOTE — Progress Notes (Signed)
 Dear Dr. Anders, Here is my assessment for our mutual patient, Tonya Garza. Thank you for allowing me the opportunity to care for your patient. Please do not hesitate to contact me should you have any other questions. Sincerely, Dr. Eldora Garza  Otolaryngology Clinic Note Referring provider: Dr. Anders HPI:  Tonya Garza is a 28 y.o. female kindly referred by Dr. Anders for evaluation of hearing loss.   Patient reports: intermittent, problem with hearing or making out what people are saying in background noise. Going on for a few years, reports other family has noticed it. Don't think it is getting worse. Wants to get her hearing checked. Maybe tinnitus in quiet but not always noticed. Patient denies: ear pain, fullness, vertigo, drainage Patient additionally denies: deep pain in ear canal, eustachian tube symptoms such as popping, crackling, sensitive to pressure changes Patient also denies barotrauma, vestibular suppressant use, ototoxic medication use Prior ear surgery: no Generally ears not a problem No family history of hearing loss.  No significant loud noise exposure  H&N Surgery: no Personal or FHx of bleeding dz or anesthesia difficulty: no  PMHx: Healthy  GLP-1: no AP/AC: no  Tobacco: no. Occupation: Dietician. Lives in [place], [place]  Independent Review of Additional Tests or Records:  Dr. Rosendo (05/29/2023): Noted hearing impairment over past year; more prominent in noise; no other sx including tinnitus or dizziness. TM intact; Dx: HL; Rx: Ref ENT Dr. Jesus (ENT) visit - 02/15/2019: noted tonsillitis; no abnormality noted with ears; Rx: Tonsil stones; f/u PRN TSH 10/27/2021: wnl BMP 03/13/2023: wnl  PMH/Meds/All/SocHx/FamHx/ROS:  History reviewed. No pertinent past medical history.   History reviewed. No pertinent surgical history.  History reviewed. No pertinent family history.   Social Connections: Moderately Isolated (05/26/2023)   Social  Connection and Isolation Panel [NHANES]    Frequency of Communication with Friends and Family: Twice a week    Frequency of Social Gatherings with Friends and Family: Twice a week    Attends Religious Services: 1 to 4 times per year    Active Member of [employer] or Organizations: No    Attends Engineer, Structural: Not on file    Marital Status: Never married      Current Outpatient Medications:    levonorgestrel (MIRENA) 20 MCG/DAY IUD, , Disp: , Rfl:    Physical Exam:   BP 113/77 (BP Location: Right Arm, Patient Position: Sitting, Cuff Size: Normal)   Pulse 65   Resp 19   Ht 5' 7 (1.702 m)   Wt 144 lb (65.3 kg)   LMP 07/20/2023 (Approximate)   SpO2 98%   BMI 22.55 kg/m   Salient findings:  CN II-XII intact  Bilateral EAC clear and TM intact with well pneumatized middle ear spaces Weber 512: mid Rinne 512: AC > BC b/l  Anterior rhinoscopy: Septum intact; bilateral inferior turbinates without significant hypertrophy No lesions of oral cavity/oropharynx No obviously palpable neck masses/lymphadenopathy/thyromegaly No respiratory distress or stridor  Seprately Identifiable Procedures:  None  Impression & Plans:  Tonya Garza is a 28 y.o. female with:  1. Subjective hearing loss    Exam today reassuring; we discussed need for further testing, so will order audiogram; she is not having any other sx or FHX of HL. Depending on results of HT, will follow up; if normal, will opt to monitor  - Audiogram; will discuss after audio  See below regarding exact medications prescribed this encounter including dosages and route: No orders of the defined types were placed in  this encounter.     Thank you for allowing me the opportunity to care for your patient. Please do not hesitate to contact me should you have any other questions.  Sincerely, Tonya Blanch, MD Otolarynoglogist (ENT), [facility] Phone: [phone] Fax: [phone]  07/25/2023,  7:47 AM   MDM:  Level 3 Complexity/Problems addressed: low - new problem Data complexity: mod - independent review of notes, labs; ordering tests - Morbidity: low  - Prescription Drug prescribed or managed: no

## 2023-07-28 ENCOUNTER — Telehealth (INDEPENDENT_AMBULATORY_CARE_PROVIDER_SITE_OTHER): Payer: Self-pay | Admitting: Audiology

## 2023-07-28 NOTE — Telephone Encounter (Signed)
 confirmed appt & location [id number] afm

## 2023-07-31 ENCOUNTER — Ambulatory Visit (INDEPENDENT_AMBULATORY_CARE_PROVIDER_SITE_OTHER): Payer: 59 | Admitting: Audiology

## 2023-07-31 DIAGNOSIS — H93291 Other abnormal auditory perceptions, right ear: Secondary | ICD-10-CM

## 2023-07-31 DIAGNOSIS — Z0110 Encounter for examination of ears and hearing without abnormal findings: Secondary | ICD-10-CM | POA: Diagnosis not present

## 2023-07-31 DIAGNOSIS — F4323 Adjustment disorder with mixed anxiety and depressed mood: Secondary | ICD-10-CM | POA: Diagnosis not present

## 2023-07-31 NOTE — Progress Notes (Signed)
  [address] [place], [place] [address] [phone]  Audiological Evaluation    Name: Tonya Garza     DOB:   September 12, 1995      MRN:   [id number]                                                                                     Service Date: 07/31/2023     Accompanied by: unaccompanied    Patient comes today after Dr. Tobie, ENT sent a referral for a hearing evaluation due to concerns with hearing loss.   Symptoms Yes Details  Hearing loss  [x]  Perceived when there is background noise   Tinnitus  [x]  Possible some at times - very soft   Ear pain/ Ear infections  []    Balance problems  []    Noise exposure  []    Previous ear surgeries  []    Family history  []    Amplification  []    Other  []      Otoscopy: Right ear: Clear external ear canals and notable landmarks visualized on the tympanic membrane. Left ear:  Clear external ear canals and notable landmarks visualized on the tympanic membrane.  Tympanometry: Right ear: Type A- Normal external ear canal volume with normal middle ear pressure and tympanic membrane compliance Left ear: Type A- Normal external ear canal volume with normal middle ear pressure and tympanic membrane compliance    Pure tone Audiometry: Right ear- Normal hearing from [phone].  Left ear-  Normal hearing from [phone].   The hearing test results were completed under headphones and results are deemed to be of good reliability. Test technique:  conventional     Speech Audiometry: Right ear- Speech Reception Threshold (SRT) was obtained at 5 dBHL Left ear-Speech Reception Threshold (SRT) was obtained at 10 dBHL   Word Recognition Score Tested using NU-6 (MLV) Right ear: 96% was obtained at a presentation level of 55 dBHL with contralateral masking which is deemed as  excellent Left ear: 100% was obtained at a presentation level of 55 dBHL with contralateral masking which is deemed as  excellent      Recommendations: Return for a hearing evaluation if concerns with hearing changes arise or per MD recommendation. Consider Auditory Processing Evaluation, pending medical opinion and referral.   Alazne Quant MARIE LEROUX-MARTINEZ, AUD

## 2023-08-03 ENCOUNTER — Telehealth (INDEPENDENT_AMBULATORY_CARE_PROVIDER_SITE_OTHER): Payer: Self-pay | Admitting: Otolaryngology

## 2023-08-03 DIAGNOSIS — H93291 Other abnormal auditory perceptions, right ear: Secondary | ICD-10-CM

## 2023-08-03 NOTE — Telephone Encounter (Signed)
Patient has normal hearing; query if she has auditory processing disorder; will refer to [name] AUD. F/u PRN with ENT [employer]

## 2023-08-14 DIAGNOSIS — F4323 Adjustment disorder with mixed anxiety and depressed mood: Secondary | ICD-10-CM | POA: Diagnosis not present

## 2023-08-21 DIAGNOSIS — F4323 Adjustment disorder with mixed anxiety and depressed mood: Secondary | ICD-10-CM | POA: Diagnosis not present

## 2023-08-28 DIAGNOSIS — F4323 Adjustment disorder with mixed anxiety and depressed mood: Secondary | ICD-10-CM | POA: Diagnosis not present

## 2023-09-13 DIAGNOSIS — F4323 Adjustment disorder with mixed anxiety and depressed mood: Secondary | ICD-10-CM | POA: Diagnosis not present

## 2023-09-18 DIAGNOSIS — F4323 Adjustment disorder with mixed anxiety and depressed mood: Secondary | ICD-10-CM | POA: Diagnosis not present

## 2023-10-02 DIAGNOSIS — F4323 Adjustment disorder with mixed anxiety and depressed mood: Secondary | ICD-10-CM | POA: Diagnosis not present

## 2023-10-06 DIAGNOSIS — Z30432 Encounter for removal of intrauterine contraceptive device: Secondary | ICD-10-CM | POA: Diagnosis not present

## 2023-10-09 DIAGNOSIS — F4323 Adjustment disorder with mixed anxiety and depressed mood: Secondary | ICD-10-CM | POA: Diagnosis not present

## 2023-10-16 DIAGNOSIS — F4323 Adjustment disorder with mixed anxiety and depressed mood: Secondary | ICD-10-CM | POA: Diagnosis not present

## 2023-10-23 DIAGNOSIS — F4323 Adjustment disorder with mixed anxiety and depressed mood: Secondary | ICD-10-CM | POA: Diagnosis not present

## 2023-11-06 DIAGNOSIS — F4323 Adjustment disorder with mixed anxiety and depressed mood: Secondary | ICD-10-CM | POA: Diagnosis not present

## 2023-11-20 DIAGNOSIS — F4323 Adjustment disorder with mixed anxiety and depressed mood: Secondary | ICD-10-CM | POA: Diagnosis not present

## 2023-12-11 DIAGNOSIS — F4323 Adjustment disorder with mixed anxiety and depressed mood: Secondary | ICD-10-CM | POA: Diagnosis not present

## 2023-12-18 DIAGNOSIS — F4323 Adjustment disorder with mixed anxiety and depressed mood: Secondary | ICD-10-CM | POA: Diagnosis not present

## 2024-01-01 DIAGNOSIS — F4323 Adjustment disorder with mixed anxiety and depressed mood: Secondary | ICD-10-CM | POA: Diagnosis not present

## 2024-01-08 DIAGNOSIS — F4323 Adjustment disorder with mixed anxiety and depressed mood: Secondary | ICD-10-CM | POA: Diagnosis not present

## 2024-01-09 ENCOUNTER — Telehealth (INDEPENDENT_AMBULATORY_CARE_PROVIDER_SITE_OTHER): Payer: Self-pay | Admitting: Otolaryngology

## 2024-01-09 ENCOUNTER — Telehealth (INDEPENDENT_AMBULATORY_CARE_PROVIDER_SITE_OTHER): Payer: Self-pay

## 2024-01-09 NOTE — Telephone Encounter (Signed)
 Patient left a message stating that she never received a call about scheduling for auditory processing, she said you had discussed this with her back in January

## 2024-01-09 NOTE — Telephone Encounter (Signed)
 Called patient per your request and gave her the phone # for [facility] so that she can schedule an Auditory Processing Eval.  Dr. Tobie put in a referral in January 2025.

## 2024-01-12 ENCOUNTER — Encounter: Payer: Self-pay | Admitting: Student

## 2024-01-17 DIAGNOSIS — F4323 Adjustment disorder with mixed anxiety and depressed mood: Secondary | ICD-10-CM | POA: Diagnosis not present

## 2024-02-05 DIAGNOSIS — F4323 Adjustment disorder with mixed anxiety and depressed mood: Secondary | ICD-10-CM | POA: Diagnosis not present

## 2024-02-07 ENCOUNTER — Ambulatory Visit

## 2024-02-07 VITALS — BP 108/74 | HR 69 | Ht 67.0 in | Wt 148.0 lb

## 2024-02-07 DIAGNOSIS — G8929 Other chronic pain: Secondary | ICD-10-CM

## 2024-02-07 DIAGNOSIS — H9202 Otalgia, left ear: Secondary | ICD-10-CM | POA: Diagnosis not present

## 2024-02-07 DIAGNOSIS — M542 Cervicalgia: Secondary | ICD-10-CM

## 2024-02-07 NOTE — Patient Instructions (Addendum)
 It was great to see you! Thank you for allowing me to participate in your care!   I recommend that you always bring your medications to each appointment as this makes it easy to ensure we are on the correct medications and helps us  not miss when refills are needed.  Our plans for today:  - You will receive a call to be scheduled in our OMT clinic, if you cannot fit in at the most recent available time, we can try another date. - You will receive a call from PT - You can continue to use Tylenol ibuprofen  over-the-counter.  Additionally you can try lidocaine patches or capsaicin cream. - If your ear pain does not improve, or you develop difficulty hearing or drainage, please schedule appointment for reevaluation  Take care and seek immediate care sooner if you develop any concerns. Please remember to show up 15 minutes before your scheduled appointment time!  Gladis Church, DO [facility]
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
## 2024-02-12 DIAGNOSIS — F4323 Adjustment disorder with mixed anxiety and depressed mood: Secondary | ICD-10-CM | POA: Diagnosis not present

## 2024-02-19 DIAGNOSIS — F4323 Adjustment disorder with mixed anxiety and depressed mood: Secondary | ICD-10-CM | POA: Diagnosis not present

## 2024-02-26 DIAGNOSIS — F4323 Adjustment disorder with mixed anxiety and depressed mood: Secondary | ICD-10-CM | POA: Diagnosis not present

## 2024-03-04 ENCOUNTER — Other Ambulatory Visit: Payer: Self-pay

## 2024-03-04 ENCOUNTER — Ambulatory Visit: Attending: Family Medicine | Admitting: Physical Therapy

## 2024-03-04 DIAGNOSIS — M542 Cervicalgia: Secondary | ICD-10-CM | POA: Diagnosis not present

## 2024-03-04 DIAGNOSIS — F4323 Adjustment disorder with mixed anxiety and depressed mood: Secondary | ICD-10-CM | POA: Diagnosis not present

## 2024-03-04 DIAGNOSIS — G8929 Other chronic pain: Secondary | ICD-10-CM | POA: Diagnosis not present

## 2024-03-04 DIAGNOSIS — R293 Abnormal posture: Secondary | ICD-10-CM | POA: Diagnosis not present
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
## 2024-03-11 ENCOUNTER — Other Ambulatory Visit: Payer: Self-pay

## 2024-03-11 ENCOUNTER — Encounter: Payer: Self-pay | Admitting: Family Medicine

## 2024-03-11 ENCOUNTER — Ambulatory Visit (INDEPENDENT_AMBULATORY_CARE_PROVIDER_SITE_OTHER): Admitting: Family Medicine

## 2024-03-11 VITALS — BP 110/72 | Ht 67.0 in | Wt 150.0 lb

## 2024-03-11 DIAGNOSIS — M7662 Achilles tendinitis, left leg: Secondary | ICD-10-CM

## 2024-03-11 DIAGNOSIS — F4323 Adjustment disorder with mixed anxiety and depressed mood: Secondary | ICD-10-CM | POA: Diagnosis not present

## 2024-03-11 NOTE — Progress Notes (Signed)
    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  She reports that it is most bothersome within the first 20 minutes of her run and will often wax and wane over the course of her distance running.  Some days it does not hurt at all, and other days it is sore to the touch.  She has not noticed any loss of strength, sensation or ability.  She has been contemplating beginning to train for marathon, but does not want to undertake this unless she is healthy enough to do so.  PERTINENT  PMH / PSH: Noncontributory  OBJECTIVE:   BP 110/72   Ht 5' 7 (1.702 m)   Wt 150 lb (68 kg)   BMI 23.49 kg/m   General: Well-appearing female, no distress MSK: Full active and passive range of motion at the ankles knees and hips bilaterally.  Right Achilles tendon nontender to palpation, grossly intact without apparent defect.  Heel squeeze negative.  Left Achilles tendon minimally tender to palpation about 1 cm proximal to the Achilles insertion point no pain or tenderness over the muscle belly.  Calf squeeze and heel squeeze negative.  US  left Achilles tendon limited Achilles insertion to the calcaneal bone well-visualized.  No evidence of echogenicity or calcific reaction present.  Some proximal thickening of the Achilles tendon itself with associated neovascularization on Doppler.  Closer to the muscle belly the tendon thins out to equal thickness to that of the insertion point.  Taken together these findings are indicative of chronic tendinopathy.  ASSESSMENT/PLAN:   Assessment & Plan Tendonitis, Achilles, left - Discussed options including home rehab versus formal physical therapy evaluation, use of nitroglycerin patches, and heel cups. -Patient prefers to proceed with home rehab exercises for the time being -Advised use of bilateral gel heel cups for improved cushioning during trail runs -Patient to follow-up in 6 to 8 weeks, or  sooner if symptoms worsen.  Tonya Pinal, DO [facility]

## 2024-03-14 DIAGNOSIS — F4323 Adjustment disorder with mixed anxiety and depressed mood: Secondary | ICD-10-CM | POA: Diagnosis not present

## 2024-03-22 ENCOUNTER — Ambulatory Visit: Attending: Family Medicine

## 2024-03-22 DIAGNOSIS — R293 Abnormal posture: Secondary | ICD-10-CM | POA: Diagnosis not present

## 2024-03-22 DIAGNOSIS — M542 Cervicalgia: Secondary | ICD-10-CM | POA: Insufficient documentation
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
## 2024-03-25 ENCOUNTER — Encounter: Payer: Self-pay | Admitting: Physical Therapy

## 2024-03-25 ENCOUNTER — Ambulatory Visit: Admitting: Physical Therapy

## 2024-03-25 DIAGNOSIS — R293 Abnormal posture: Secondary | ICD-10-CM | POA: Diagnosis not present

## 2024-03-25 DIAGNOSIS — F4323 Adjustment disorder with mixed anxiety and depressed mood: Secondary | ICD-10-CM | POA: Diagnosis not present

## 2024-03-25 DIAGNOSIS — M542 Cervicalgia: Secondary | ICD-10-CM
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
## 2024-04-01 ENCOUNTER — Encounter: Payer: Self-pay | Admitting: Student

## 2024-04-01 ENCOUNTER — Ambulatory Visit: Admitting: Physical Therapy

## 2024-04-01 ENCOUNTER — Encounter: Admitting: Physical Therapy

## 2024-04-01 ENCOUNTER — Encounter: Payer: Self-pay | Admitting: Physical Therapy

## 2024-04-01 ENCOUNTER — Ambulatory Visit (INDEPENDENT_AMBULATORY_CARE_PROVIDER_SITE_OTHER): Admitting: Student

## 2024-04-01 VITALS — BP 103/64 | HR 71 | Ht 67.0 in | Wt 145.5 lb

## 2024-04-01 DIAGNOSIS — Z0000 Encounter for general adult medical examination without abnormal findings: Secondary | ICD-10-CM

## 2024-04-01 DIAGNOSIS — M542 Cervicalgia: Secondary | ICD-10-CM

## 2024-04-01 DIAGNOSIS — L659 Nonscarring hair loss, unspecified: Secondary | ICD-10-CM

## 2024-04-01 DIAGNOSIS — F4323 Adjustment disorder with mixed anxiety and depressed mood: Secondary | ICD-10-CM | POA: Diagnosis not present

## 2024-04-01 DIAGNOSIS — R293 Abnormal posture: Secondary | ICD-10-CM

## 2024-04-01 NOTE — Assessment & Plan Note (Addendum)
 Generally healthy today.  Reviewed patient's Family Medical History Reviewed and updated list of patient's medical providers Counseled patient on tobacco, alcohol use Recommend moderate exercise at least 150 hours a week Emphasized need for healthy dieting Assessment of cognitive impairment was done Assessed patient's functional ability Health Risk Assessent Completed and Reviewed. Lab Ordered: Lipid panel, CBC and BMP

## 2024-04-01 NOTE — Patient Instructions (Signed)
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
## 2024-04-01 NOTE — Patient Instructions (Addendum)
 Pleasure to see you today  Today we ordered lab to check your thyroid , blood count, electrolyte level, kidney function and cholesterol levels.  Also as discussed you could consider multivitamins with higher dose of Biotin to help with your hair.  Please go to the [facility] on [address], [address], [place], [place].

## 2024-04-01 NOTE — Progress Notes (Signed)
    SUBJECTIVE:   CHIEF COMPLAINT / HPI:   28 year old female history of chronic tonsillitis presenting today for routine well check.  Today he denies any medical concerns or mild headache tingling.  Started noticing this about few months ago, unsure if this is related to her taking out her Mirena IUD.  Period as been regular since taking of her IUD, every 28 days and lasting 4-5 days. LMP was 9//25.  Works as a Health and safety inspector, eats [employer] with good appetite.  Endorses normal bowel movement.  No history of tobacco or illicit drug use.  Describes herself as a social drinker who drinks sparingly.   Sexual active with female partner. And follows with OB/GYN who does have STD testing and Pap smear. Pap is up-to-date, with last testing in 2023 normal.  PERTINENT  PMH / PSH: Reviewed   OBJECTIVE:   BP 103/64   Pulse 71   Ht 5' 7 (1.702 m)   Wt 145 lb 8 oz (66 kg)   LMP 03/21/2024   SpO2 100%   BMI 22.79 kg/m    Physical Exam General: Alert, well appearing, NAD Cardiovascular: RRR, No Murmurs, Normal S2/S2 Respiratory: CTAB, No wheezing or Rales Abdomen: No distension or tenderness Extremities: No edema on extremities Psych: Pleasant, normal affect, good judgment Skin: Warm and dry  ASSESSMENT/PLAN:   Well adult health check Generally healthy today.  Reviewed patient's Family Medical History Reviewed and updated list of patient's medical providers Counseled patient on tobacco, alcohol use Recommend moderate exercise at least 150 hours a week Emphasized need for healthy dieting Assessment of cognitive impairment was done Assessed patient's functional ability Health Risk Assessent Completed and Reviewed. Lab Ordered: Lipid panel, CBC and BMP   Alopecia Patient endorses some hair thining in the last few month. Reports occasionally feeling stressed but nothing overtly persistent. Does have family history of balding in the family.  -Ordered lab for TSH -Recommend  vitamins with biotin    Merrick Maggio, MD [facility]

## 2024-04-02 LAB — LIPID PANEL
Chol/HDL Ratio: 2.6 ratio (ref 0.0–4.4)
Cholesterol, Total: 166 mg/dL (ref 100–199)
HDL: 64 mg/dL (ref >39)
LDL Chol Calc (NIH): 94 mg/dL (ref 0–99)
Triglycerides: 36 mg/dL (ref 0–149)
VLDL Cholesterol Cal: 8 mg/dL (ref 5–40)

## 2024-04-02 LAB — CBC
Hematocrit: 43.9 % (ref 34.0–46.6)
Hemoglobin: 14.1 g/dL (ref 11.1–15.9)
MCH: 30.4 pg (ref 26.6–33.0)
MCHC: 32.1 g/dL (ref 31.5–35.7)
MCV: 95 fL (ref 79–97)
Platelets: 210 x10E3/uL (ref 150–450)
RBC: 4.64 x10E6/uL (ref 3.77–5.28)
RDW: 12.4 % (ref 11.7–15.4)
WBC: 4.9 x10E3/uL (ref 3.4–10.8)

## 2024-04-02 LAB — BASIC METABOLIC PANEL WITH GFR
BUN/Creatinine Ratio: 14 (ref 9–23)
BUN: 9 mg/dL (ref 6–20)
CO2: 20 mmol/L (ref 20–29)
Calcium: 9.7 mg/dL (ref 8.7–10.2)
Chloride: 103 mmol/L (ref 96–106)
Creatinine, Ser: 0.65 mg/dL (ref 0.57–1.00)
Glucose: 85 mg/dL (ref 70–99)
Potassium: 4.8 mmol/L (ref 3.5–5.2)
Sodium: 139 mmol/L (ref 134–144)
eGFR: 124 mL/min/1.73 (ref >59)

## 2024-04-02 LAB — TSH: TSH: 1.06 u[IU]/mL (ref 0.450–4.500)

## 2024-04-04 ENCOUNTER — Encounter: Payer: Self-pay | Admitting: Family Medicine

## 2024-04-04 ENCOUNTER — Ambulatory Visit: Payer: Self-pay | Admitting: Student

## 2024-04-04 ENCOUNTER — Ambulatory Visit (INDEPENDENT_AMBULATORY_CARE_PROVIDER_SITE_OTHER): Admitting: Family Medicine

## 2024-04-04 DIAGNOSIS — M9900 Segmental and somatic dysfunction of head region: Secondary | ICD-10-CM

## 2024-04-04 DIAGNOSIS — G8929 Other chronic pain: Secondary | ICD-10-CM | POA: Diagnosis not present

## 2024-04-04 DIAGNOSIS — M9908 Segmental and somatic dysfunction of rib cage: Secondary | ICD-10-CM | POA: Diagnosis not present

## 2024-04-04 DIAGNOSIS — M9901 Segmental and somatic dysfunction of cervical region: Secondary | ICD-10-CM

## 2024-04-04 DIAGNOSIS — M9902 Segmental and somatic dysfunction of thoracic region: Secondary | ICD-10-CM

## 2024-04-04 DIAGNOSIS — M546 Pain in thoracic spine: Secondary | ICD-10-CM | POA: Insufficient documentation
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
## 2024-04-04 NOTE — Patient Instructions (Signed)
 It was great to see you!  Our plans for today:  - Continue your home PT exercises and staying active.  - Try laying on a tennis ball on tight/stiff areas or knots in upper back for 10-15 minutes daily or unto the point of toleration.   - Come back in 1-2 months as needed for repeat treatment.  Take care and seek immediate care sooner if you develop any concerns.   Dr. Julia Alkhatib
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
## 2024-04-08 ENCOUNTER — Encounter: Payer: Self-pay | Admitting: Physical Therapy

## 2024-04-08 ENCOUNTER — Ambulatory Visit: Admitting: Physical Therapy

## 2024-04-08 DIAGNOSIS — M542 Cervicalgia: Secondary | ICD-10-CM

## 2024-04-08 DIAGNOSIS — R293 Abnormal posture: Secondary | ICD-10-CM | POA: Diagnosis not present

## 2024-04-08 DIAGNOSIS — F4323 Adjustment disorder with mixed anxiety and depressed mood: Secondary | ICD-10-CM | POA: Diagnosis not present
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
## 2024-04-15 DIAGNOSIS — F4323 Adjustment disorder with mixed anxiety and depressed mood: Secondary | ICD-10-CM | POA: Diagnosis not present

## 2024-04-22 ENCOUNTER — Ambulatory Visit: Admitting: Family Medicine

## 2024-04-22 DIAGNOSIS — F4323 Adjustment disorder with mixed anxiety and depressed mood: Secondary | ICD-10-CM | POA: Diagnosis not present

## 2024-04-29 DIAGNOSIS — F4323 Adjustment disorder with mixed anxiety and depressed mood: Secondary | ICD-10-CM | POA: Diagnosis not present

## 2024-05-06 DIAGNOSIS — F4323 Adjustment disorder with mixed anxiety and depressed mood: Secondary | ICD-10-CM | POA: Diagnosis not present

## 2024-05-13 DIAGNOSIS — F4323 Adjustment disorder with mixed anxiety and depressed mood: Secondary | ICD-10-CM | POA: Diagnosis not present

## 2024-05-20 DIAGNOSIS — F4323 Adjustment disorder with mixed anxiety and depressed mood: Secondary | ICD-10-CM | POA: Diagnosis not present

## 2024-05-27 DIAGNOSIS — F4323 Adjustment disorder with mixed anxiety and depressed mood: Secondary | ICD-10-CM | POA: Diagnosis not present

## 2024-06-10 DIAGNOSIS — F4323 Adjustment disorder with mixed anxiety and depressed mood: Secondary | ICD-10-CM | POA: Diagnosis not present

## 2024-06-12 ENCOUNTER — Encounter: Payer: Self-pay | Admitting: Student

## 2024-06-17 DIAGNOSIS — Z309 Encounter for contraceptive management, unspecified: Secondary | ICD-10-CM | POA: Diagnosis not present

## 2024-06-17 DIAGNOSIS — F4323 Adjustment disorder with mixed anxiety and depressed mood: Secondary | ICD-10-CM | POA: Diagnosis not present

## 2024-06-17 DIAGNOSIS — Z6823 Body mass index (BMI) 23.0-23.9, adult: Secondary | ICD-10-CM | POA: Diagnosis not present

## 2024-06-17 DIAGNOSIS — Z01419 Encounter for gynecological examination (general) (routine) without abnormal findings: Secondary | ICD-10-CM | POA: Diagnosis not present

## 2024-06-19 ENCOUNTER — Other Ambulatory Visit: Payer: Self-pay

## 2024-06-19 ENCOUNTER — Telehealth: Payer: Self-pay

## 2024-06-19 DIAGNOSIS — Z808 Family history of malignant neoplasm of other organs or systems: Secondary | ICD-10-CM

## 2024-06-19 NOTE — Telephone Encounter (Signed)
 Patient called about derm clinic appt that was scheduled. She is unable to come when we have availability and wants to know if we can send a referral to [facility] please.

## 2024-06-24 DIAGNOSIS — F4323 Adjustment disorder with mixed anxiety and depressed mood: Secondary | ICD-10-CM | POA: Diagnosis not present

## 2024-06-27 ENCOUNTER — Ambulatory Visit

## 2024-07-01 DIAGNOSIS — F4323 Adjustment disorder with mixed anxiety and depressed mood: Secondary | ICD-10-CM | POA: Diagnosis not present

## 2024-07-04 ENCOUNTER — Ambulatory Visit

## 2024-07-04 VITALS — BP 119/78 | HR 82 | Ht 67.0 in | Wt 151.6 lb

## 2024-07-04 DIAGNOSIS — L989 Disorder of the skin and subcutaneous tissue, unspecified: Secondary | ICD-10-CM | POA: Diagnosis not present

## 2024-07-04 DIAGNOSIS — Z1283 Encounter for screening for malignant neoplasm of skin: Secondary | ICD-10-CM | POA: Diagnosis not present

## 2024-07-04 NOTE — Patient Instructions (Signed)
 Sunscreen AVS General Information About Sunscreen   Who needs sunscreen? Everyone. Sunscreen use can help prevent skin cancer by protecting you from the sun's harmful ultraviolet rays. Anyone can get skin cancer, regardless of age, gender or race. In fact, it is estimated that one in five Americans will develop skin cancer in their lifetime.  What sunscreen should I use? To protect your skin from sunburn, early skin aging, and skin cancer, the American Academy of Dermatology recommends everyone use sunscreen that offers the following:  Broad-spectrum protection (protects against UVA and UVB rays) SPF 30 or higher Water resistance   However, sunscreen alone cannot fully protect you. In addition to wearing sunscreen, dermatologists recommend taking the following steps to protect your skin and find skin cancer early:  Seek shade when appropriate, remembering that the sun's rays are strongest between 10 a.m. and 2 p.m. If your shadow is shorter than you are, seek shade. Dress to protect yourself from the sun by wearing a lightweight long-sleeved shirt, pants, a wide-brimmed hat and sunglasses, when possible.  Use extra caution near water, snow and sand as they reflect the damaging rays of the sun, which can increase your chance of sunburn.  Get vitamin D safely through a healthy diet that may include vitamin supplements. Don't seek the sun. Avoid tanning beds. Ultraviolet light from the sun and tanning beds can cause skin cancer and wrinkling. If you want to look tan, you may wish to use a self-tanning product, but continue to use sunscreen with it. Check your birthday suit on your birthday. If you notice anything changing, itching or bleeding on your skin, see a board-certified dermatologist. Skin cancer is highly treatable when caught early.  When should I use sunscreen? Every day you go outside--even if you're just walking to and from your form of transportation. The sun emits harmful UV rays  year-round. Even on cloudy days, up to 80 percent of the sun's harmful UV rays can penetrate your skin. Snow, sand and water increase the need for sunscreen because they reflect the sun's rays.  How much sunscreen should I use, and how often should I apply it? Most people only apply 25-50 percent of the recommended amount of sunscreen. Apply enough sunscreen to cover all exposed skin. Most adults need about 1 ounce -- or enough to fill a shot glass -- to fully cover their body.  Don't forget to apply to the tops of your feet, your neck, your ears and the top of your head. Apply sunscreen to dry skin 15 minutes before going outdoors.  Skin cancer also can form on the lips. To protect your lips, apply a lip balm or lipstick that contains sunscreen with an SPF of 30 or higher.  When outdoors, reapply sunscreen approximately every two hours, or after swimming or sweating, according to the directions on the bottle.   Broad-spectrum sunscreens protect against both UVA and UVB rays. What is the difference between the rays? Sunlight consists of two types of harmful rays that reach the earth -- UVA rays and UVB rays. Overexposure to either can lead to skin cancer. In addition to causing skin cancer, here's what each of these rays do:  UVA rays (or aging rays) can prematurely age your skin, causing wrinkles and age spots, and can pass through window glass. UVB rays (or burning rays) are the primary cause of sunburn and are blocked by window glass  There is no safe way to tan. Every time you tan, you damage your  skin. As this damage builds, you speed up the aging of your skin and increase your risk for all types of skin cancer.  What type of sunscreen should I use? The best type of sunscreen is the one you will use again and again. Just make sure it offers broad-spectrum (UVA and UVB) protection, has an SPF of 30+, and is water-resistant. The kind of sunscreen you use is a matter of personal choice, and may vary  depending on the area of the body to be protected. Available sunscreen options include lotions, creams, gels, ointments, wax sticks and sprays. Creams are best for dry skin and the face. Gels are good for hairy areas, such as the scalp or female chest.  Sticks are good to use around the eyes.  Sprays are sometimes preferred by parents since they are easy to apply to children.  There also are sunscreens made for specific purposes, such as for sensitive skin and babies.   Some sunscreen products are also available in combination with moisturizers and cosmetics. While these products are convenient, they also need to be reapplied in order to achieve the best sun protection.  Sunscreen also may be sold in combination with an insect repellant. The AAD recommends purchasing and using these products separately -- sunscreen needs to be applied generously and often, whereas insect repellant should be used sparingly and much less frequently.  What is the difference between chemical and physical sunscreens? Chemical sunscreens work like a sponge, absorbing the sun's rays. They contain one or more of the following active ingredients: oxybenzone, avobenzone, octisalate, octocrylene, homosalate and octinoxate. These formulations tend to be easier to rub into the skin without leaving a white residue.   Physical sunscreens work like a shield, sitting sit on the surface of your skin and deflecting the sun's rays. They contain the active ingredients zinc oxide and/or titanium dioxide. Use this sunscreen if you have sensitive skin.   Can I use the sunscreen I bought last summer, or do I need to purchase a new bottle each year? Dermatologists recommend using sunscreen every day when you are outside, not just during the summer. If you are using sunscreen every day in the correct amount, a bottle should not last long. If you find a bottle of sunscreen that you have not used for some time, here are some guidelines:  The FDA  requires that all sunscreens retain their original strength for at least three years.  Some sunscreens include an expiration date. If the expiration date has passed, throw out the sunscreen. If you buy a sunscreen that does not have an expiration date, write the date you bought the sunscreen on the bottle. That way, you'll know when to throw it out.  You also can look for visible signs that the sunscreen may no longer be good. Any obvious changes in the color or consistency of the product mean it's time to purchase a new bottle.   Will using sunscreen limit the amount of vitamin D I get? Using sunscreen may decrease your skin's production of vitamin D. If you are concerned that you are not getting enough vitamin D, you should discuss your options for getting vitamin D with your doctor. Many people can get the vitamin D they need from foods and/or vitamin supplements. This approach gives you the vitamin D you need without increasing your risk for skin cancer.   How do I treat a sunburn? It's important to begin treating a sunburn as soon as possible. In addition  to stopping further UV exposure, dermatologists recommend treating a sunburn with: Cool baths to reduce the heat.  Moisturizer to help ease the discomfort caused by dryness. As soon as you get out of the bathtub, gently pat yourself dry, but leave a little water on your skin. Then apply a moisturizer to trap the water in your skin.  Hydrocortisone cream that you can buy without a prescription to help ease discomfort. Over the counter pain relief medications that are safe for you to take. This can help reduce the swelling, redness and discomfort. Drinking extra water. A sunburn draws fluid to the skin surface and away from the rest of the body. Drinking extra water prevents dehydration.  Do not treat sunburns with -caine products (such as benzocaine).   If your skin blisters, you have a second-degree sunburn. Dermatologists recommend that you:   Allow the blisters to heal untouched. Blisters form to help your skin heal and protect you from infection.  If the blisters cover a large area, such as the entire back, or you have chills, a headache or a fever, seek immediate medical care.   With any sunburn, you should avoid the sun while your skin heals. Be sure to cover the sunburn every time you head outdoors.  Sunscreen that may leave less white residue on Black or Brown skin: Supergoop! Unseen Sunscreen SPF 40 Supergoop! Play Everyday Lotion SPF 50 Neutrogena Hydro Boost Water Gel Lotion SPF 50 Paula's Choice Resist Skin Restoring Moisturizer SPF 50 Bondi Sands Fragrance Free Facial Sunscreen Lotion SPF 50+ Kate [place] UncompliKated SPF 50 Setting Spray ISEHAN Kiss Me Mommy UV Mild Gel SPF 33 PA+++ Bliss Block Star Invisible Daily Sunscreen SPF 30 La Roche-Posay Anthelios Lotion Spray Sunscreen SPF 60 La Roche-Posay Oil-Free Anthelios Clear Skin SPF 60 Black Girl Sunscreen SPF 30 Neostrata Sheer Physical SPF 50 Aveeno Positively Radiant Daily Moisturizer SPF 30 Elta MD Elements SPF 44 ISDIN Eryfotona Ageless SPF 50 Glossier Invisible Shield SPF 35 Dr. Marinda Schultze Lightweight Wrinkle Defense SPF 30 [employer] Double Defense Day Moisturizer SPF 30  Adapted from:  [email or web address]  [email or web address]
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
## 2024-07-29 ENCOUNTER — Encounter: Payer: Self-pay | Admitting: Family Medicine

## 2024-07-29 ENCOUNTER — Other Ambulatory Visit: Payer: Self-pay | Admitting: Family Medicine

## 2024-07-29 ENCOUNTER — Ambulatory Visit
Admission: RE | Admit: 2024-07-29 | Discharge: 2024-07-29 | Disposition: A | Source: Ambulatory Visit | Attending: Family Medicine | Admitting: Family Medicine

## 2024-07-29 ENCOUNTER — Ambulatory Visit: Admitting: Family Medicine

## 2024-07-29 VITALS — BP 108/78 | Ht 67.0 in | Wt 150.0 lb

## 2024-07-29 DIAGNOSIS — M25562 Pain in left knee: Secondary | ICD-10-CM

## 2024-07-29 DIAGNOSIS — M25561 Pain in right knee: Secondary | ICD-10-CM
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
## 2024-07-30 ENCOUNTER — Encounter: Payer: Self-pay | Admitting: Family Medicine

## 2024-07-30 ENCOUNTER — Ambulatory Visit: Payer: Self-pay | Admitting: Family Medicine

## 2024-07-30 ENCOUNTER — Inpatient Hospital Stay: Admission: RE | Admit: 2024-07-30 | Discharge: 2024-07-30 | Attending: Family Medicine | Admitting: Family Medicine

## 2024-07-30 ENCOUNTER — Other Ambulatory Visit: Payer: Self-pay

## 2024-07-30 DIAGNOSIS — M25562 Pain in left knee: Secondary | ICD-10-CM

## 2024-07-30 DIAGNOSIS — M25561 Pain in right knee: Secondary | ICD-10-CM

## 2024-07-30 NOTE — Progress Notes (Signed)
 Spoke to patient on the phone 07/30/2024 at 1440 EST. reviewed left knee MRI results showing nondisplaced nondepressed depressed subchondral fracture of the posterior lateral tibial plateau.  Also has suspected chondral delamination injury along the lateral tibial plateau articular cartilage.  Advised patient to be nonweightbearing with crutches.  She does have crutches at home.  She will start using these today.  Given the delamination injury, do recommend that she has an evaluation with an orthopedic surgeon.  Will place referral to Dr. Bonner Hair with [facility].  Also notes that she has been experiencing increasing pain in the right knee, which she also did fall on.  Given findings of MRI on the left we will also proceed with MRI of the right knee to ensure no signs of occult fracture or cartilage injury.  She expressed understanding and agreement with above.  All questions were answered.  She will reach out with any questions or concerns. --------------------------  [place]- Please place order for stat MRI of the right knee rule out cartilage injury or occult fracture status post climbing  injury.  Also please place referral to Dr. Hair for left knee chondral delamination with associated subchondral fracture status post claiming injury, eval for possible surgery.  Please share their contact information with her.  Please let me know if you have any questions.  Thanks!

## 2024-08-01 ENCOUNTER — Ambulatory Visit: Payer: Self-pay | Admitting: Family Medicine

## 2024-08-01 ENCOUNTER — Inpatient Hospital Stay: Admission: RE | Admit: 2024-08-01 | Discharge: 2024-08-01 | Attending: Family Medicine | Admitting: Family Medicine

## 2024-08-01 DIAGNOSIS — M25561 Pain in right knee: Secondary | ICD-10-CM

## 2024-08-01 NOTE — Progress Notes (Signed)
 MRI reviewed.  MyChart message sent.

## 2024-08-19 ENCOUNTER — Ambulatory Visit

## 2024-08-23 ENCOUNTER — Ambulatory Visit

## 2024-08-23 ENCOUNTER — Other Ambulatory Visit: Payer: Self-pay

## 2024-08-23 DIAGNOSIS — M25561 Pain in right knee: Secondary | ICD-10-CM

## 2024-08-23 DIAGNOSIS — R600 Localized edema: Secondary | ICD-10-CM

## 2024-08-23 NOTE — Therapy (Signed)
 " OUTPATIENT PHYSICAL THERAPY LOWER EXTREMITY EVALUATION   Patient Name: Tonya Garza MRN: [id number] DOB:1995-09-14, 29 y.o., female Today's Date: 08/23/2024  END OF SESSION:  PT End of Session - 08/23/24 0819     Visit Number 1    Number of Visits 12    Date for Recertification  10/04/24    Authorization Type [facility] [employer]    Authorization Time Period VL - medical necessity    PT Start Time 0830    PT Stop Time 0910    PT Time Calculation (min) 40 min    Activity Tolerance Patient tolerated treatment well    Behavior During Therapy [facility] for tasks assessed/performed          History reviewed. No pertinent past medical history. History reviewed. No pertinent surgical history. Patient Active Problem List   Diagnosis Date Noted   Thoracic back pain 04/04/2024   Elbow pain, right 12/14/2022   Heart palpitations 10/27/2021   Injury of coccyx 01/08/2021   IUD threads lost 08/05/2020   Well adult health check 01/27/2020   Chronic tonsillitis 02/15/2019    PCP: Alena Morrison, Reagan, MD  REFERRING PROVIDER: Cristy Bonner DASEN, MD  REFERRING DIAG: [phone] (ICD-10-CM) - Tear of articular cartilage of left knee, current, initial encounter  THERAPY DIAG:  Acute pain of both knees  Localized edema  Rationale for Evaluation and Treatment: Rehabilitation  ONSET DATE: mid Jan  SUBJECTIVE:   SUBJECTIVE STATEMENT: Pt reports she was bouldering 3 weeks ago when she fell off the wall and landed into a deep squat with forward momentum. She notes pain directly after especially with squatting. Pt was on crutches for 10 days before seeing the orthopedic surgeon. He advised her to perform low impact activities only for 6 weeks. Outside of rock climbing, the pt runs, roller skates, and does yoga. Pt continues to have pain with deep squatting.    PERTINENT HISTORY: Nothing of note PAIN:  Are you having pain? Yes: NPRS scale: 0/10 Pain location: deep anterior under  patella B Pain description: tightness, sharp Aggravating factors: squat, stairs Relieving factors: rest  PRECAUTIONS: None  RED FLAGS: None   WEIGHT BEARING RESTRICTIONS: No  FALLS:  Has patient fallen in last 6 months? No  LIVING ENVIRONMENT: Lives with: lives with their family Lives in: House/apartment Has following equipment at home: None  OCCUPATION: dietician   PLOF: Independent  PATIENT GOALS: back to rock climbing, running, roller skating   NEXT MD VISIT: none scheduled   OBJECTIVE:  Note: Objective measures were completed at Evaluation unless otherwise noted.  DIAGNOSTIC FINDINGS:  Left knee IMPRESSION: 1. Nondisplaced non-depressed subchondral fracture of the posterior lateral tibial plateau with associated surrounding marrow edema. 2. Irregularity of the overlying posterior lateral tibial plateau articular cartilage with possible chondral delamination measuring approximately 11 x 9 mm. 3. Grade 1 chondromalacia patella. 4. Mild edema in the superolateral aspect of Hoffa's fat pad may be posttraumatic or can be associated with anterior impingement. 5. No acute meniscal or ligamentous abnormality.  Right knee IMPRESSION: 1. Mild patellar chondromalacia. 2. Trace knee effusion.  PATIENT SURVEYS:  LEFS  Extreme difficulty/unable (0), Quite a bit of difficulty (1), Moderate difficulty (2), Little difficulty (3), No difficulty (4) Survey date:  08/23/2024  Any of your usual work, housework or school activities 3  2. Usual hobbies, recreational or sporting activities 1  3. Getting into/out of the bath 3  4. Walking between rooms 4  5. Putting on socks/shoes 3  6. Squatting  1  7. Lifting an object, like a bag of groceries from the floor 4  8. Performing light activities around your home 4  9. Performing heavy activities around your home 3  10. Getting into/out of a car 4  11. Walking 2 blocks 4  12. Walking 1 mile 3  13. Going up/down 10 stairs (1  flight) 3  14. Standing for 1 hour 3  15.  sitting for 1 hour 4  16. Running on even ground 2  17. Running on uneven ground 2  18. Making sharp turns while running fast 1  19. Hopping  2  20. Rolling over in bed 4  Score total:  58/80     COGNITION: Overall cognitive status: Within functional limits for tasks assessed     SENSATION: Not tested  POSTURE: No Significant postural limitations  PALPATION: Slight tenderness to medial L knee  LOWER EXTREMITY ROM:  Active and Passive ROM Right eval Left eval  Hip flexion    Hip extension    Hip abduction    Hip adduction    Hip internal rotation    Hip external rotation    Knee flexion WNL*/WNL WNL*/WNL  Knee extension WNL WNL  Ankle dorsiflexion    Ankle plantarflexion    Ankle inversion    Ankle eversion     (Blank rows = not tested)(* = pain)  LOWER EXTREMITY MMT:  MMT Right eval Left eval  Hip flexion    Hip extension    Hip abduction    Hip adduction    Hip internal rotation    Hip external rotation    Knee flexion    Knee extension    Ankle dorsiflexion    Ankle plantarflexion    Ankle inversion    Ankle eversion     (Blank rows = not tested)  LOWER EXTREMITY SPECIAL TESTS:  Knee special tests: McMurray's test: negative  FUNCTIONAL TESTS:  SLS: able B   Functional squat: pain starting at 90 knee flexion  Lunge: able B   SL heel raise: able B  GAIT: Distance walked: 56ft Assistive device utilized: None Level of assistance: Complete Independence Comments: normal                                                                                                                                TREATMENT DATE:  [facility] Adult PT Treatment:                                                DATE: 08/23/2024    Initial evaluation: see patient education and home exercise program as noted below    PATIENT EDUCATION:  Education details: POC, HEP, diagnosis, prognosis.  Person educated: Patient Education method:  Explanation and Handouts Education comprehension: verbalized understanding and  needs further education  HOME EXERCISE PROGRAM: Access Code: [email or web address] URL: [email or web address] Date: 08/23/2024 Prepared by: Marijo Berber  Exercises - Supine Hamstrings Curl on Foam Roller  - 1 x daily - 4-5 x weekly - 3 sets - 10 reps - Side Stepping with Resistance at Feet  - 1 x daily - 4-5 x weekly - 3 sets - 10 reps - Plank with Hip Extension  - 1 x daily - 4-5 x weekly - 3 sets - 10 reps - Supine Hip Flexion with Resistance Loop  - 1 x daily - 4-5 x weekly - 3 sets - 10 reps - Deadlift with Resistance  - 1 x daily - 4-5 x weekly - 3 sets - 10 reps  ASSESSMENT:  CLINICAL IMPRESSION: Patient is a 29 y.o. F who was seen today for physical therapy evaluation and treatment for B knee pain (L>R). The pt fell from a bouldering wall into a deep squat with forward momentum (possibly valgus) about 3 weeks ago. She continues to deep, anterior pain with deep squatting, FABER position when seated, and end range knee flexion when seated. MRI demonstrated no meniscal or ligamentous damage. The pt will benefit from skilled physical therapy to decrease pain and increase function.    OBJECTIVE IMPAIRMENTS: decreased activity tolerance, increased fascial restrictions, impaired flexibility, improper body mechanics, and pain.   ACTIVITY LIMITATIONS: squatting and stairs  PARTICIPATION LIMITATIONS: community activity  PERSONAL FACTORS: Fitness are also affecting patient's functional outcome.   REHAB POTENTIAL: Good  CLINICAL DECISION MAKING: Stable/uncomplicated  EVALUATION COMPLEXITY: Low   GOALS: Goals reviewed with patient? Yes  SHORT TERM GOALS: Target date: 09/13/2024 Pt will be compliant and independent with HEP to assist with symptom management/recovery at home.  Baseline: Goal status: INITIAL  2.  Pt will have no pain with end range AROM knee flexion B. Baseline: see objective  Goal  status: INITIAL  LONG TERM GOALS: Target date: 10/04/2024  Pt will be able to squat full depth without knee pain.  Baseline: see objective  Goal status: INITIAL  2.  Pt will be able to run 1 mile without knee pain.  Baseline: not running currently Goal status: INITIAL  3.  Pt will be able to  Baseline:  Goal status: INITIAL  4.  Pt will be comfortable with her final HEP in order to continue any symptom management at home and to avoid regression.   Baseline:  Goal status: INITIAL   PLAN:  PT FREQUENCY: 1-2x/week  PT DURATION: 6 weeks  PLANNED INTERVENTIONS: 97164- PT Re-evaluation, 97110-Therapeutic exercises, 97530- Therapeutic activity, [id number]- Neuromuscular re-education, 97535- Self Care, [id number]- Manual therapy, [id number]- Electrical stimulation (unattended), 97016- Vasopneumatic device, 20560 (1-2 muscles), 20561 (3+ muscles)- Dry Needling, Patient/Family education, Taping, Joint mobilization, Joint manipulation, Cryotherapy, and Moist heat  PLAN FOR NEXT SESSION: HEP review. Hip and knee strengthening under load. Full knee ROM exercises.    Marijo DELENA Berber, PT 08/23/2024, 9:31 AM  "

## 2024-09-02 ENCOUNTER — Ambulatory Visit

## 2024-09-09 ENCOUNTER — Ambulatory Visit

## 2024-09-16 ENCOUNTER — Ambulatory Visit

## 2024-09-23 ENCOUNTER — Ambulatory Visit
